# Patient Record
Sex: Female | Born: 1975 | Race: Black or African American | Hispanic: No | Smoking: Current every day smoker
Health system: Southern US, Community
[De-identification: ages and names within clinical notes are randomized; demographics above are authoritative.]

---

## 2010-07-30 ENCOUNTER — Emergency Department (INDEPENDENT_AMBULATORY_CARE_PROVIDER_SITE_OTHER): Payer: Self-pay

## 2010-07-30 ENCOUNTER — Emergency Department (HOSPITAL_BASED_OUTPATIENT_CLINIC_OR_DEPARTMENT_OTHER)
Admission: EM | Admit: 2010-07-30 | Discharge: 2010-07-30 | Disposition: A | Discharge status: Home / Self Care | Payer: Self-pay | Attending: Emergency Medicine | Admitting: Emergency Medicine

## 2010-07-30 DIAGNOSIS — J189 Pneumonia, unspecified organism: Secondary | ICD-10-CM | POA: Insufficient documentation

## 2010-07-30 DIAGNOSIS — B9689 Other specified bacterial agents as the cause of diseases classified elsewhere: Secondary | ICD-10-CM | POA: Insufficient documentation

## 2010-07-30 DIAGNOSIS — N760 Acute vaginitis: Secondary | ICD-10-CM | POA: Insufficient documentation

## 2010-07-30 DIAGNOSIS — R059 Cough, unspecified: Secondary | ICD-10-CM

## 2010-07-30 DIAGNOSIS — Z87891 Personal history of nicotine dependence: Secondary | ICD-10-CM

## 2010-07-30 DIAGNOSIS — N72 Inflammatory disease of cervix uteri: Secondary | ICD-10-CM | POA: Insufficient documentation

## 2010-07-30 DIAGNOSIS — N390 Urinary tract infection, site not specified: Secondary | ICD-10-CM | POA: Insufficient documentation

## 2010-07-30 DIAGNOSIS — R05 Cough: Secondary | ICD-10-CM

## 2010-07-30 DIAGNOSIS — A599 Trichomoniasis, unspecified: Secondary | ICD-10-CM | POA: Insufficient documentation

## 2010-07-30 DIAGNOSIS — A499 Bacterial infection, unspecified: Secondary | ICD-10-CM | POA: Insufficient documentation

## 2010-07-30 DIAGNOSIS — R109 Unspecified abdominal pain: Secondary | ICD-10-CM

## 2010-07-30 DIAGNOSIS — R509 Fever, unspecified: Secondary | ICD-10-CM

## 2010-07-30 LAB — URINALYSIS, ROUTINE W REFLEX MICROSCOPIC
Glucose, UA: NEGATIVE mg/dL
Hgb urine dipstick: NEGATIVE
Ketones, ur: 15 mg/dL — AB
Protein, ur: 30 mg/dL — AB

## 2010-07-30 LAB — WET PREP, GENITAL: Yeast Wet Prep HPF POC: NONE SEEN

## 2010-07-30 LAB — PREGNANCY, URINE: Preg Test, Ur: NEGATIVE

## 2010-07-30 LAB — URINE MICROSCOPIC-ADD ON

## 2010-12-17 ENCOUNTER — Emergency Department (INDEPENDENT_AMBULATORY_CARE_PROVIDER_SITE_OTHER): Payer: Self-pay

## 2010-12-17 ENCOUNTER — Encounter: Payer: Self-pay | Admitting: *Deleted

## 2010-12-17 ENCOUNTER — Emergency Department (HOSPITAL_BASED_OUTPATIENT_CLINIC_OR_DEPARTMENT_OTHER)
Admission: EM | Admit: 2010-12-17 | Discharge: 2010-12-17 | Disposition: A | Discharge status: Home / Self Care | Payer: Self-pay | Attending: Emergency Medicine | Admitting: Emergency Medicine

## 2010-12-17 ENCOUNTER — Other Ambulatory Visit: Payer: Self-pay

## 2010-12-17 DIAGNOSIS — R0602 Shortness of breath: Secondary | ICD-10-CM

## 2010-12-17 DIAGNOSIS — R002 Palpitations: Secondary | ICD-10-CM

## 2010-12-17 DIAGNOSIS — F17200 Nicotine dependence, unspecified, uncomplicated: Secondary | ICD-10-CM | POA: Insufficient documentation

## 2010-12-17 DIAGNOSIS — R079 Chest pain, unspecified: Secondary | ICD-10-CM

## 2010-12-17 LAB — DIFFERENTIAL
Eosinophils Absolute: 0.1 10*3/uL (ref 0.0–0.7)
Eosinophils Relative: 2 % (ref 0–5)
Lymphs Abs: 1.6 10*3/uL (ref 0.7–4.0)
Monocytes Relative: 7 % (ref 3–12)

## 2010-12-17 LAB — COMPREHENSIVE METABOLIC PANEL
Alkaline Phosphatase: 45 U/L (ref 39–117)
BUN: 9 mg/dL (ref 6–23)
CO2: 27 mEq/L (ref 19–32)
Calcium: 9.1 mg/dL (ref 8.4–10.5)
GFR calc Af Amer: >90 mL/min (ref >90)
GFR calc non Af Amer: >90 mL/min (ref >90)
Glucose, Bld: 104 mg/dL — ABNORMAL HIGH (ref 70–99)
Potassium: 3.7 mEq/L (ref 3.5–5.1)
Total Protein: 7.1 g/dL (ref 6.0–8.3)

## 2010-12-17 LAB — CBC
Hemoglobin: 11 g/dL — ABNORMAL LOW (ref 12.0–15.0)
MCH: 29.5 pg (ref 26.0–34.0)
MCV: 89.8 fL (ref 78.0–100.0)
RBC: 3.73 MIL/uL — ABNORMAL LOW (ref 3.87–5.11)

## 2010-12-17 LAB — CARDIAC PANEL(CRET KIN+CKTOT+MB+TROPI)
Relative Index: 1.2 (ref 0.0–2.5)
Total CK: 202 U/L — ABNORMAL HIGH (ref 7–177)

## 2010-12-17 MED ORDER — KETOROLAC TROMETHAMINE 30 MG/ML IJ SOLN
30.0000 mg | Freq: Once | INTRAMUSCULAR | Status: AC
  Administered 2010-12-17: 30 mg via INTRAVENOUS
  Filled 2010-12-17: qty 1

## 2010-12-17 NOTE — ED Notes (Signed)
Pt c/o chest pain described as pressure with SOB x 1 hr

## 2010-12-17 NOTE — ED Notes (Signed)
I took ecg and gave copy of old and new to Dr. Anitra Lauth, per nurse Sue Lush I had patient sit back in waiting room and notified Dr. Demetrius Charity of same.

## 2010-12-17 NOTE — ED Provider Notes (Signed)
History     CSN: [id number] Arrival date & time: 12/17/2010  3:12 PM   First MD Initiated Contact with Patient 12/17/10 1538      Chief Complaint  Patient presents with  . Chest Pain  . Palpitations    (Consider location/radiation/quality/duration/timing/severity/associated sxs/prior treatment) Patient is a 35 y.o. female presenting with chest pain and palpitations. The history is provided by the patient. No language interpreter was used.  Chest Pain The chest pain began 1 - 2 hours ago. Chest pain occurs intermittently. The chest pain is unchanged. The pain is associated with breathing and coughing. The quality of the pain is described as pleuritic. The pain radiates to the upper back. Primary symptoms include shortness of breath and palpitations. Pertinent negatives for primary symptoms include no fever, no syncope, no cough, no nausea, no vomiting and no dizziness.  The palpitations also occurred with shortness of breath. The palpitations did not occur with dizziness.  Pertinent negatives for associated symptoms include no near-syncope, no numbness and no weakness. She tried nothing for the symptoms.    Palpitations  Associated symptoms include chest pain and shortness of breath. Pertinent negatives include no fever, no numbness, no near-syncope, no syncope, no nausea, no vomiting, no dizziness, no weakness and no cough.    History reviewed. No pertinent past medical history.  Past Surgical History  Procedure Date  . Cesarean section     History reviewed. No pertinent family history.  History  Substance Use Topics  . Smoking status: Current Everyday Smoker -- 1.0 packs/day  . Smokeless tobacco: Not on file  . Alcohol Use:     OB History    Grav Para Term Preterm Abortions TAB SAB Ect Mult Living                  Review of Systems  Constitutional: Negative for fever.  Respiratory: Positive for shortness of breath. Negative for cough.   Cardiovascular: Positive  for chest pain and palpitations. Negative for syncope and near-syncope.  Gastrointestinal: Negative for nausea and vomiting.  Neurological: Negative for dizziness, weakness and numbness.  All other systems reviewed and are negative.    Allergies  Review of patient's allergies indicates no known allergies.  Home Medications   Current Outpatient Rx  Name Route Sig Dispense Refill  . ASPIRIN 325 MG PO TBEC Oral Take 650 mg by mouth once.        BP 131/85  Pulse 75  Temp(Src) 98.1 F (36.7 C) (Oral)  Resp 18  Ht 5\' 6"  (1.676 m)  Wt 133 lb (60.328 kg)  BMI 21.47 kg/m2  SpO2 100%  LMP 12/13/2010  Physical Exam  Nursing note and vitals reviewed. Constitutional: She is oriented to person, place, and time. She appears well-developed and well-nourished.  HENT:  Head: Normocephalic.  Eyes: Pupils are equal, round, and reactive to light.  Neck: Normal range of motion. Neck supple.  Cardiovascular: Normal rate and regular rhythm.   Pulmonary/Chest: Effort normal and breath sounds normal.  Abdominal: Bowel sounds are normal.  Musculoskeletal: Normal range of motion.  Neurological: She is alert and oriented to person, place, and time.  Skin: Skin is warm and dry.    ED Course  Procedures (including critical care time)  Labs Reviewed  CBC - Abnormal; Notable for the following:    RBC 3.73 (*)    Hemoglobin 11.0 (*)    HCT 33.5 (*)    All other components within normal limits  DIFFERENTIAL  COMPREHENSIVE METABOLIC  PANEL  CARDIAC PANEL(CRET KIN+CKTOT+MB+TROPI)   Dg Chest 2 View  12/17/2010  *RADIOLOGY REPORT*  Clinical Data: Shortness of breath, palpitations, chest pain, smoker  CHEST - 2 VIEW  Comparison: 07/30/2010  Findings: Normal heart size, mediastinal contours, and pulmonary vascularity. Lungs clear. Bones unremarkable. No pneumothorax.  IMPRESSION: No acute abnormalities.  Original Report Authenticated By: Lollie Marrow, M.D.    Date: 12/17/2010  Rate64  Rhythm:  normal sinus rhythm  QRS Axis: normal  Intervals: normal  ST/T Wave abnormalities: normal  Conduction Disutrbances:none  Narrative Interpretation:   Old EKG Reviewed: none available   1. Chest pain       MDM  Pt has risk factor of smoking otherwise healthy:pt is cp free at this time:unlikley cardiac:pe considered although pt is not tachycardic or hypoxic:pt is okay to follow up for palpitation with cardiology for monitoring as needed for continued symptoms       Teressa Lower, NP 12/17/10 1651  Medical screening examination/treatment/procedure(s) were performed by non-physician practitioner and as supervising physician I was immediately available for consultation/collaboration.   Gwyneth Sprout, MD 12/22/10 1718

## 2011-05-08 ENCOUNTER — Emergency Department (HOSPITAL_BASED_OUTPATIENT_CLINIC_OR_DEPARTMENT_OTHER)
Admission: EM | Admit: 2011-05-08 | Discharge: 2011-05-08 | Disposition: A | Discharge status: Home / Self Care | Payer: Self-pay | Attending: Emergency Medicine | Admitting: Emergency Medicine

## 2011-05-08 ENCOUNTER — Encounter (HOSPITAL_BASED_OUTPATIENT_CLINIC_OR_DEPARTMENT_OTHER): Payer: Self-pay | Admitting: *Deleted

## 2011-05-08 DIAGNOSIS — F17200 Nicotine dependence, unspecified, uncomplicated: Secondary | ICD-10-CM | POA: Insufficient documentation

## 2011-05-08 DIAGNOSIS — H10219 Acute toxic conjunctivitis, unspecified eye: Secondary | ICD-10-CM | POA: Insufficient documentation

## 2011-05-08 MED ORDER — FLUORESCEIN SODIUM 1 MG OP STRP
ORAL_STRIP | OPHTHALMIC | Status: AC
  Filled 2011-05-08: qty 1

## 2011-05-08 MED ORDER — FLUORESCEIN SODIUM 1 MG OP STRP: 1.0000 | ORAL_STRIP | Freq: Once | OPHTHALMIC | Status: DC

## 2011-05-08 MED ORDER — TETRACAINE HCL 0.5 % OP SOLN
OPHTHALMIC | Status: AC
  Filled 2011-05-08: qty 2

## 2011-05-08 NOTE — ED Notes (Signed)
Cant remember if she took her contact out of her left eye. Vision is blurry.

## 2011-05-08 NOTE — ED Provider Notes (Signed)
History     CSN: [id number]  Arrival date & time 05/08/11  2035   None     Chief Complaint  Patient presents with  . Eye Problem    (Consider location/radiation/quality/duration/timing/severity/associated sxs/prior treatment) Patient is a 36 y.o. female presenting with eye problem. The history is provided by the patient.  Eye Problem   She was taking out her contact lenses after she had put hand sanitizer on her hands and immediately noted some burning discomfort in her left eye. Discomfort was moderate and she rated it a 5/10 at, and is improving and is down to 2/10. She denies any visual change. She denies other injury.  No past medical history on file.  Past Surgical History  Procedure Date  . Cesarean section   . Cesarean section     No family history on file.  History  Substance Use Topics  . Smoking status: Current Everyday Smoker -- 1.0 packs/day  . Smokeless tobacco: Not on file  . Alcohol Use: Yes    OB History    Grav Para Term Preterm Abortions TAB SAB Ect Mult Living                  Review of Systems  All other systems reviewed and are negative.    Allergies  Review of patient's allergies indicates no known allergies.  Home Medications   Current Outpatient Rx  Name Route Sig Dispense Refill  . DEXTROMETHORPHAN HBR 15 MG/5ML PO SYRP Oral Take 10 mLs by mouth 4 (four) times daily as needed. Patient used this medication for cough.    . NYQUIL PO Oral Take 5 mLs by mouth daily as needed. Patient used this medication for cold.    Marland Kitchen SODIUM & POTASSIUM BICARBONATE PO TBEF Oral Take 2 tablets by mouth daily as needed. Patient used this medication for cold.      BP 126/78  Pulse 74  Temp(Src) 97.9 F (36.6 C) (Oral)  Resp 20  SpO2 100%  Physical Exam  Nursing note and vitals reviewed.  36 year old female is resting comfortably and in no acute distress. Vital signs are normal. Oxygen saturation is 100% which is normal. Head is normocephalic and  atraumatic. PERRLA, EOMI. There is slight erythema of the conjunctiva of the left eye. The cornea appears normal and anterior chamber is clear. The eye stained with fluorescein and examined with a Wood's lamp and there is no increased uptake over the cornea, although there are a few scattered areas of increased uptake over the conjunctiva. This is consistent with a chemical conjunctivitis. There is no preauricular lymph nodes palpable. Tell her Chalmers Guest is clear. Neck is nontender and supple. Lungs are clear without rales, wheezes, rhonchi. Heart has regular rate and rhythm without murmur. Abdomen is soft, flat, nontender without masses or hepatosplenomegaly. Extremities have no cyanosis or edema, full range of motion is present. Neurologic: Mental status is normal, cranial nerves are intact, there no focal motor or sensory deficits.  ED Course  Procedures (including critical care time)  Visual acuity is 20/200 in each eye without corrective lenses. There is no evidence of any corneal or actual eye injury. Since pain is improving, I do not feel she needs topical antibiotics or anything other than over-the-counter analgesics.   1. Chemical conjunctivitis       MDM  Conjunctival irritation without evidence of corneal abrasion.        Dione Booze, MD 05/08/11 [phone]

## 2011-05-08 NOTE — Discharge Instructions (Signed)
You may wear your contacts as soon as you're eye no longer feels irritated.  Chemical Conjunctivitis Chemical conjunctivitis is an irritation of the underside of the eyelid and the white part of the eye. Conjunctivitis can be caused by infection, allergy or chemical irritation. In your case it has been caused by a chemical irritation of the eye. Symptoms almost always include: tearing, light sensitivity, gritty feeling (sensation) in the eyes, swelling of your eyelids, and often severe pain. In spite of the severe pain, this irritation will run its course and will improve within 24 hours.  HOME CARE INSTRUCTIONS   To ease discomfort apply a cool, clean wash cloth to your eye for 10 to 20 minutes, 3 to 4 times per day.   Do not rub your eyes.   Gently wipe away any discharge from the eyes with moistened tissues.   Wash your hands often with soap and use paper towels to dry.   Sunglasses may be helpful if light bothers your eyes.   Do not use eye make-up.   Do not use contact lenses until the irritation is gone.   Do not operate machinery or drive if your vision is blurred.   Take medications as directed by your caregiver. Artificial tears may ease discomfort.   Avoid the chemical or surroundings which caused the problem. Always use eye protection as necessary.  SEEK MEDICAL CARE IF:   The eye is still pink (inflamed) 3 days after beginning treatment.   Pain in the eye increases.   You have discharge coming from either eye.   Your eyelids are stuck together in the morning.   You have an increased sensitivity to light.   An oral temperature above 102 F (38.9 C) develops.   You develop facial pain.   You have any problems that may be related to the medicine you are taking.  SEEK IMMEDIATE MEDICAL CARE IF:   Your vision is getting worse.   You develop severe eye pain.  MAKE SURE YOU:   Understand these instructions.   Will watch your condition.   Will get help right  away if you are not doing well or get worse.  Document Released: 11/26/2004 Document Revised: 02/05/2011 Document Reviewed: 10/05/2007 [facility] Patient Information 2012 [place], [place].

## 2014-10-05 ENCOUNTER — Encounter (HOSPITAL_BASED_OUTPATIENT_CLINIC_OR_DEPARTMENT_OTHER): Payer: Self-pay

## 2014-10-05 ENCOUNTER — Emergency Department (HOSPITAL_BASED_OUTPATIENT_CLINIC_OR_DEPARTMENT_OTHER)
Admission: EM | Admit: 2014-10-05 | Discharge: 2014-10-05 | Disposition: A | Discharge status: Home / Self Care | Payer: Medicaid Other | Attending: Emergency Medicine | Admitting: Emergency Medicine

## 2014-10-05 ENCOUNTER — Emergency Department (HOSPITAL_BASED_OUTPATIENT_CLINIC_OR_DEPARTMENT_OTHER): Payer: Medicaid Other

## 2014-10-05 DIAGNOSIS — R05 Cough: Secondary | ICD-10-CM

## 2014-10-05 DIAGNOSIS — N760 Acute vaginitis: Secondary | ICD-10-CM | POA: Insufficient documentation

## 2014-10-05 DIAGNOSIS — Z3202 Encounter for pregnancy test, result negative: Secondary | ICD-10-CM | POA: Diagnosis not present

## 2014-10-05 DIAGNOSIS — R109 Unspecified abdominal pain: Secondary | ICD-10-CM | POA: Diagnosis present

## 2014-10-05 DIAGNOSIS — R059 Cough, unspecified: Secondary | ICD-10-CM

## 2014-10-05 DIAGNOSIS — Z720 Tobacco use: Secondary | ICD-10-CM | POA: Insufficient documentation

## 2014-10-05 DIAGNOSIS — K429 Umbilical hernia without obstruction or gangrene: Secondary | ICD-10-CM | POA: Diagnosis not present

## 2014-10-05 DIAGNOSIS — J4520 Mild intermittent asthma, uncomplicated: Secondary | ICD-10-CM

## 2014-10-05 DIAGNOSIS — B9689 Other specified bacterial agents as the cause of diseases classified elsewhere: Secondary | ICD-10-CM

## 2014-10-05 LAB — URINALYSIS, ROUTINE W REFLEX MICROSCOPIC
Bilirubin Urine: NEGATIVE
Glucose, UA: NEGATIVE mg/dL
Hgb urine dipstick: NEGATIVE
Ketones, ur: NEGATIVE mg/dL
Leukocytes, UA: NEGATIVE
Nitrite: NEGATIVE
PH: 6.5 (ref 5.0–8.0)
Protein, ur: NEGATIVE mg/dL
SPECIFIC GRAVITY, URINE: 1.021 (ref 1.005–1.030)
UROBILINOGEN UA: 1 mg/dL (ref 0.0–1.0)

## 2014-10-05 LAB — WET PREP, GENITAL
Trich, Wet Prep: NONE SEEN
YEAST WET PREP: NONE SEEN

## 2014-10-05 LAB — PREGNANCY, URINE: Preg Test, Ur: NEGATIVE

## 2014-10-05 MED ORDER — ALBUTEROL SULFATE HFA 108 (90 BASE) MCG/ACT IN AERS
2.0000 | INHALATION_SPRAY | RESPIRATORY_TRACT | Status: DC | PRN
  Administered 2014-10-05: 2 via RESPIRATORY_TRACT
  Filled 2014-10-05: qty 6.7

## 2014-10-05 MED ORDER — METRONIDAZOLE 500 MG PO TABS: 500.0000 mg | ORAL_TABLET | Freq: Two times a day (BID) | ORAL | Status: DC

## 2014-10-05 NOTE — ED Notes (Signed)
Pt c/o LLQ pain, lt lower back pain and a painful knot over navel x a month; pt c/o productive cough x1wk with yellow sputum; denies vaginal discharge or urinary difficulty

## 2014-10-05 NOTE — Discharge Instructions (Signed)
Asthma Asthma is a recurring condition in which the airways tighten and narrow. Asthma can make it difficult to breathe. It can cause coughing, wheezing, and shortness of breath. Asthma episodes, also called asthma attacks, range from minor to life-threatening. Asthma cannot be cured, but medicines and lifestyle changes can help control it. CAUSES Asthma is believed to be caused by inherited (genetic) and environmental factors, but its exact cause is unknown. Asthma may be triggered by allergens, lung infections, or irritants in the air. Asthma triggers are different for each person. Common triggers include:   Animal dander.  Dust mites.  Cockroaches.  Pollen from trees or grass.  Mold.  Smoke.  Air pollutants such as dust, household cleaners, hair sprays, aerosol sprays, paint fumes, strong chemicals, or strong odors.  Cold air, weather changes, and winds (which increase molds and pollens in the air).  Strong emotional expressions such as crying or laughing hard.  Stress.  Certain medicines (such as aspirin) or types of drugs (such as beta-blockers).  Sulfites in foods and drinks. Foods and drinks that may contain sulfites include dried fruit, potato chips, and sparkling grape juice.  Infections or inflammatory conditions such as the flu, a cold, or an inflammation of the nasal membranes (rhinitis).  Gastroesophageal reflux disease (GERD).  Exercise or strenuous activity. SYMPTOMS Symptoms may occur immediately after asthma is triggered or many hours later. Symptoms include:  Wheezing.  Excessive nighttime or early morning coughing.  Frequent or severe coughing with a common cold.  Chest tightness.  Shortness of breath. DIAGNOSIS  The diagnosis of asthma is made by a review of your medical history and a physical exam. Tests may also be performed. These may include:  Lung function studies. These tests show how much air you breathe in and out.  Allergy  tests.  Imaging tests such as X-rays. TREATMENT  Asthma cannot be cured, but it can usually be controlled. Treatment involves identifying and avoiding your asthma triggers. It also involves medicines. There are 2 classes of medicine used for asthma treatment:   Controller medicines. These prevent asthma symptoms from occurring. They are usually taken every day.  Reliever or rescue medicines. These quickly relieve asthma symptoms. They are used as needed and provide short-term relief. Your health care provider will help you create an asthma action plan. An asthma action plan is a written plan for managing and treating your asthma attacks. It includes a list of your asthma triggers and how they may be avoided. It also includes information on when medicines should be taken and when their dosage should be changed. An action plan may also involve the use of a device called a peak flow meter. A peak flow meter measures how well the lungs are working. It helps you monitor your condition. HOME CARE INSTRUCTIONS   Take medicines only as directed by your health care provider. Speak with your health care provider if you have questions about how or when to take the medicines.  Use a peak flow meter as directed by your health care provider. Record and keep track of readings.  Understand and use the action plan to help minimize or stop an asthma attack without needing to seek medical care.  Control your home environment in the following ways to help prevent asthma attacks:  Do not smoke. Avoid being exposed to secondhand smoke.  Change your heating and air conditioning filter regularly.  Limit your use of fireplaces and wood stoves.  Get rid of pests (such as roaches and  mice) and their droppings.  Throw away plants if you see mold on them.  Clean your floors and dust regularly. Use unscented cleaning products.  Try to have someone else vacuum for you regularly. Stay out of rooms while they are  being vacuumed and for a short while afterward. If you vacuum, use a dust mask from a hardware store, a double-layered or microfilter vacuum cleaner bag, or a vacuum cleaner with a HEPA filter.  Replace carpet with wood, tile, or vinyl flooring. Carpet can trap dander and dust.  Use allergy-proof pillows, mattress covers, and box spring covers.  Wash bed sheets and blankets every week in hot water and dry them in a dryer.  Use blankets that are made of polyester or cotton.  Clean bathrooms and kitchens with bleach. If possible, have someone repaint the walls in these rooms with mold-resistant paint. Keep out of the rooms that are being cleaned and painted.  Wash hands frequently. SEEK MEDICAL CARE IF:   You have wheezing, shortness of breath, or a cough even if taking medicine to prevent attacks.  The colored mucus you cough up (sputum) is thicker than usual.  Your sputum changes from clear or white to yellow, green, gray, or bloody.  You have any problems that may be related to the medicines you are taking (such as a rash, itching, swelling, or trouble breathing).  You are using a reliever medicine more than 2-3 times per week.  Your peak flow is still at 50-79% of your personal best after following your action plan for 1 hour.  You have a fever. SEEK IMMEDIATE MEDICAL CARE IF:   You seem to be getting worse and are unresponsive to treatment during an asthma attack.  You are short of breath even at rest.  You get short of breath when doing very little physical activity.  You have difficulty eating, drinking, or talking due to asthma symptoms.  You develop chest pain.  You develop a fast heartbeat.  You have a bluish color to your lips or fingernails.  You are light-headed, dizzy, or faint.  Your peak flow is less than 50% of your personal best. MAKE SURE YOU:   Understand these instructions.  Will watch your condition.  Will get help right away if you are not  doing well or get worse. Document Released: 02/16/2005 Document Revised: 07/03/2013 Document Reviewed: 09/15/2012 [facility] Patient Information 2015 [place], [place]. This information is not intended to replace advice given to you by your health care provider. Make sure you discuss any questions you have with your health care provider.  Bacterial Vaginosis Bacterial vaginosis is a vaginal infection that occurs when the normal balance of bacteria in the vagina is disrupted. It results from an overgrowth of certain bacteria. This is the most common vaginal infection in women of childbearing age. Treatment is important to prevent complications, especially in pregnant women, as it can cause a premature delivery. CAUSES  Bacterial vaginosis is caused by an increase in harmful bacteria that are normally present in smaller amounts in the vagina. Several different kinds of bacteria can cause bacterial vaginosis. However, the reason that the condition develops is not fully understood. RISK FACTORS Certain activities or behaviors can put you at an increased risk of developing bacterial vaginosis, including:  Having a new sex partner or multiple sex partners.  Douching.  Using an intrauterine device (IUD) for contraception. Women do not get bacterial vaginosis from toilet seats, bedding, swimming pools, or contact with objects around them.  SIGNS AND SYMPTOMS  Some women with bacterial vaginosis have no signs or symptoms. Common symptoms include:  Grey vaginal discharge.  A fishlike odor with discharge, especially after sexual intercourse.  Itching or burning of the vagina and vulva.  Burning or pain with urination. DIAGNOSIS  Your health care provider will take a medical history and examine the vagina for signs of bacterial vaginosis. A sample of vaginal fluid may be taken. Your health care provider will look at this sample under a microscope to check for bacteria and abnormal cells. A vaginal pH test  may also be done.  TREATMENT  Bacterial vaginosis may be treated with antibiotic medicines. These may be given in the form of a pill or a vaginal cream. A second round of antibiotics may be prescribed if the condition comes back after treatment.  HOME CARE INSTRUCTIONS   Only take over-the-counter or prescription medicines as directed by your health care provider.  If antibiotic medicine was prescribed, take it as directed. Make sure you finish it even if you start to feel better.  Do not have sex until treatment is completed.  Tell all sexual partners that you have a vaginal infection. They should see their health care provider and be treated if they have problems, such as a mild rash or itching.  Practice safe sex by using condoms and only having one sex partner. SEEK MEDICAL CARE IF:   Your symptoms are not improving after 3 days of treatment.  You have increased discharge or pain.  You have a fever. MAKE SURE YOU:   Understand these instructions.  Will watch your condition.  Will get help right away if you are not doing well or get worse. FOR MORE INFORMATION  Centers for Disease Control and Prevention, Division of STD Prevention: [employer]Apps[employer] American Sexual Health Association (ASHA): www.ashastd.org  Document Released: 02/16/2005 Document Revised: 12/07/2012 Document Reviewed: 09/28/2012 [facility] Patient Information 2015 [place], [place]. This information is not intended to replace advice given to you by your health care provider. Make sure you discuss any questions you have with your health care provider.  Hernia A hernia occurs when an internal organ pushes out through a weak spot in the abdominal wall. Hernias most commonly occur in the groin and around the navel. Hernias often can be pushed back into place (reduced). Most hernias tend to get worse over time. Some abdominal hernias can get stuck in the opening (irreducible or incarcerated hernia) and cannot be reduced.  An irreducible abdominal hernia which is tightly squeezed into the opening is at risk for impaired blood supply (strangulated hernia). A strangulated hernia is a medical emergency. Because of the risk for an irreducible or strangulated hernia, surgery may be recommended to repair a hernia. CAUSES   Heavy lifting.  Prolonged coughing.  Straining to have a bowel movement.  A cut (incision) made during an abdominal surgery. HOME CARE INSTRUCTIONS   Bed rest is not required. You may continue your normal activities.  Avoid lifting more than 10 pounds (4.5 kg) or straining.  Cough gently. If you are a smoker it is best to stop. Even the best hernia repair can break down with the continual strain of coughing. Even if you do not have your hernia repaired, a cough will continue to aggravate the problem.  Do not wear anything tight over your hernia. Do not try to keep it in with an outside bandage or truss. These can damage abdominal contents if they are trapped within the hernia sac.  Eat a normal diet.  Avoid constipation. Straining over long periods of time will increase hernia size and encourage breakdown of repairs. If you cannot do this with diet alone, stool softeners may be used. SEEK IMMEDIATE MEDICAL CARE IF:   You have a fever.  You develop increasing abdominal pain.  You feel nauseous or vomit.  Your hernia is stuck outside the abdomen, looks discolored, feels hard, or is tender.  You have any changes in your bowel habits or in the hernia that are unusual for you.  You have increased pain or swelling around the hernia.  You cannot push the hernia back in place by applying gentle pressure while lying down. MAKE SURE YOU:   Understand these instructions.  Will watch your condition.  Will get help right away if you are not doing well or get worse. Document Released: 02/16/2005 Document Revised: 05/11/2011 Document Reviewed: 10/06/2007 [facility] Patient Information 2015  [place], [place]. This information is not intended to replace advice given to you by your health care provider. Make sure you discuss any questions you have with your health care provider.

## 2014-10-05 NOTE — ED Provider Notes (Signed)
CSN: [id number]     Arrival date & time 10/05/14  0620 History   First MD Initiated Contact with Patient 10/05/14 [phone]     Chief Complaint  Patient presents with  . Abdominal Pain     (Consider location/radiation/quality/duration/timing/severity/associated sxs/prior Treatment) HPI Comments: Patient presents with 3 distinct complaints. First, she complains of a productive cough with wheezing. She states it's been going on for about a week. She has a remote history of asthma but hasn't used inhaler in about a year. She is a smoker. She denies any fevers. She denies any URI symptoms. She denies any chest pain. Secondly she complains of a knot to her umbilical area. She states that's been there for about a year. She states it changes in size. Thirdly she complains of about a week history of some pain to her left lower pelvic area. It radiates to her left lower back. She denies any urinary symptoms. She denies any vaginal bleeding or discharge. Her last menstrual period was July 17. She denies any change in bowel habits.  Patient is a 39 y.o. female presenting with abdominal pain.  Abdominal Pain Associated symptoms: cough   Associated symptoms: no chest pain, no chills, no diarrhea, no fatigue, no fever, no hematuria, no nausea, no shortness of breath, no vaginal bleeding, no vaginal discharge and no vomiting     History reviewed. No pertinent past medical history. Past Surgical History  Procedure Laterality Date  . Cesarean section    . Cesarean section     No family history on file. History  Substance Use Topics  . Smoking status: Current Every Day Smoker -- 1.00 packs/day  . Smokeless tobacco: Not on file  . Alcohol Use: Yes   OB History    No data available     Review of Systems  Constitutional: Negative for fever, chills, diaphoresis and fatigue.  HENT: Negative for congestion, rhinorrhea and sneezing.   Eyes: Negative.   Respiratory: Positive for cough and wheezing. Negative  for chest tightness and shortness of breath.   Cardiovascular: Negative for chest pain and leg swelling.  Gastrointestinal: Positive for abdominal pain. Negative for nausea, vomiting, diarrhea and blood in stool.  Genitourinary: Negative for frequency, hematuria, flank pain, vaginal bleeding, vaginal discharge and difficulty urinating.  Musculoskeletal: Negative for back pain and arthralgias.  Skin: Negative for rash.  Neurological: Negative for dizziness, speech difficulty, weakness, numbness and headaches.      Allergies  Review of patient's allergies indicates no known allergies.  Home Medications   Prior to Admission medications   Medication Sig Start Date End Date Taking? Authorizing Provider  dextromethorphan 15 MG/5ML syrup Take 10 mLs by mouth 4 (four) times daily as needed. Patient used this medication for cough.    Historical Provider, MD  metroNIDAZOLE (FLAGYL) 500 MG tablet Take 1 tablet (500 mg total) by mouth 2 (two) times daily. One po bid x 7 days 10/05/14   Rolan Bucco, MD  Pseudoeph-Doxylamine-DM-APAP (NYQUIL PO) Take 5 mLs by mouth daily as needed. Patient used this medication for cold.    Historical Provider, MD  sodium-potassium bicarbonate (ALKA-SELTZER GOLD) TBEF Take 2 tablets by mouth daily as needed. Patient used this medication for cold.    Historical Provider, MD   BP 143/92 mmHg  Pulse 72  Temp(Src) 98.3 F (36.8 C) (Oral)  Resp 18  Ht 5\' 6"  (1.676 m)  Wt 134 lb (60.782 kg)  BMI 21.64 kg/m2  SpO2 100%  LMP 09/16/2014 Physical Exam  Constitutional: She is oriented to person, place, and time. She appears well-developed and well-nourished.  HENT:  Head: Normocephalic and atraumatic.  Eyes: Pupils are equal, round, and reactive to light.  Neck: Normal range of motion. Neck supple.  Cardiovascular: Normal rate, regular rhythm and normal heart sounds.   Pulmonary/Chest: Effort normal and breath sounds normal. No respiratory distress. She has no wheezes.  She has no rales. She exhibits no tenderness.  Abdominal: Soft. Bowel sounds are normal. There is tenderness. There is no rebound and no guarding.  There is a small dime-sized peri-umbilical hernia. It is reducible. It's mildly tender. She has some tenderness to her left lower pelvic area. There is no peritoneal signs. There is no CVA tenderness.  Genitourinary:  Moderate thick white discharge.  No CMT, mild left adnexal tenderness  Musculoskeletal: Normal range of motion. She exhibits no edema.  Lymphadenopathy:    She has no cervical adenopathy.  Neurological: She is alert and oriented to person, place, and time.  Skin: Skin is warm and dry. No rash noted.  Psychiatric: She has a normal mood and affect.    ED Course  Procedures (including critical care time) Labs Review Labs Reviewed  WET PREP, GENITAL - Abnormal; Notable for the following:    Clue Cells Wet Prep HPF POC MANY (*)    WBC, Wet Prep HPF POC FEW (*)    All other components within normal limits  URINALYSIS, ROUTINE W REFLEX MICROSCOPIC (NOT AT [facility]) - Abnormal; Notable for the following:    APPearance CLOUDY (*)    All other components within normal limits  PREGNANCY, URINE  GC/CHLAMYDIA PROBE AMP ([place]) NOT AT [facility]    Imaging Review Dg Chest 2 View  10/05/2014   CLINICAL DATA:  39 year old female with cough congestion wheezing and head cold for 1 week. Initial encounter. Smoker.  EXAM: CHEST  2 VIEW  COMPARISON:  12/17/2010.  FINDINGS: Stable lung volumes at the upper limits of normal. Normal cardiac size and mediastinal contours. Visualized tracheal air column is within normal limits. No pneumothorax, pulmonary edema, pleural effusion or consolidation. Mildly increased interstitial markings appear stable. No acute pulmonary opacity. No osseous abnormality identified.  IMPRESSION: No acute cardiopulmonary abnormality.   Electronically Signed   By: Odessa Fleming M.D.   On: 10/05/2014 07:11     EKG Interpretation None       MDM   Final diagnoses:  Asthma, mild intermittent, uncomplicated  Umbilical hernia without obstruction and without gangrene  BV (bacterial vaginosis)    Patient has no evidence of pneumonia. Her lungs are essentially clear on my exam. She was dispensed an albuterol inhaler as she states she's been wheezing at home. She does not appear to be any distress today. Her abdomen has a small umbilical hernia that is reducible with no signs of obstruction. She was referred to [facility] surgery for definitive repair of this. Return precautions were given if she were to have any signs of incarceration. She does have evidence of bacterial vaginosis and some mild lower abdominal tenderness on exam. I will prescribe Flagyl and she was referred to women's outpatient clinic if her symptoms are not improving. Pregnancy test was negative. Her urine is negative.    Rolan Bucco, MD 10/05/14 [phone]

## 2014-10-08 LAB — GC/CHLAMYDIA PROBE AMP (~~LOC~~) NOT AT ARMC
CHLAMYDIA, DNA PROBE: NEGATIVE
Neisseria Gonorrhea: NEGATIVE

## 2016-04-19 ENCOUNTER — Emergency Department (HOSPITAL_BASED_OUTPATIENT_CLINIC_OR_DEPARTMENT_OTHER)
Admission: EM | Admit: 2016-04-19 | Discharge: 2016-04-20 | Disposition: A | Discharge status: Home / Self Care | Payer: Self-pay | Attending: Emergency Medicine | Admitting: Emergency Medicine

## 2016-04-19 ENCOUNTER — Emergency Department (HOSPITAL_BASED_OUTPATIENT_CLINIC_OR_DEPARTMENT_OTHER): Payer: Self-pay

## 2016-04-19 ENCOUNTER — Encounter (HOSPITAL_BASED_OUTPATIENT_CLINIC_OR_DEPARTMENT_OTHER): Payer: Self-pay | Admitting: Emergency Medicine

## 2016-04-19 DIAGNOSIS — F17200 Nicotine dependence, unspecified, uncomplicated: Secondary | ICD-10-CM | POA: Insufficient documentation

## 2016-04-19 DIAGNOSIS — Z79899 Other long term (current) drug therapy: Secondary | ICD-10-CM | POA: Insufficient documentation

## 2016-04-19 DIAGNOSIS — J069 Acute upper respiratory infection, unspecified: Secondary | ICD-10-CM | POA: Insufficient documentation

## 2016-04-19 LAB — CBC WITH DIFFERENTIAL/PLATELET
Basophils Absolute: 0 10*3/uL (ref 0.0–0.1)
Basophils Relative: 1 %
EOS PCT: 1 %
Eosinophils Absolute: 0.1 10*3/uL (ref 0.0–0.7)
HEMATOCRIT: 35 % — AB (ref 36.0–46.0)
Hemoglobin: 11.4 g/dL — ABNORMAL LOW (ref 12.0–15.0)
LYMPHS ABS: 2.4 10*3/uL (ref 0.7–4.0)
LYMPHS PCT: 33 %
MCH: 29.5 pg (ref 26.0–34.0)
MCHC: 32.6 g/dL (ref 30.0–36.0)
MCV: 90.7 fL (ref 78.0–100.0)
MONO ABS: 0.6 10*3/uL (ref 0.1–1.0)
MONOS PCT: 8 %
NEUTROS ABS: 4.3 10*3/uL (ref 1.7–7.7)
Neutrophils Relative %: 57 %
PLATELETS: 323 10*3/uL (ref 150–400)
RBC: 3.86 MIL/uL — ABNORMAL LOW (ref 3.87–5.11)
RDW: 12.8 % (ref 11.5–15.5)
WBC: 7.4 10*3/uL (ref 4.0–10.5)

## 2016-04-19 LAB — PREGNANCY, URINE: Preg Test, Ur: NEGATIVE

## 2016-04-19 LAB — D-DIMER, QUANTITATIVE: D-Dimer, Quant: <0.27 ug/mL-FEU (ref 0.00–0.50)

## 2016-04-19 LAB — URINALYSIS, ROUTINE W REFLEX MICROSCOPIC
Bilirubin Urine: NEGATIVE
GLUCOSE, UA: NEGATIVE mg/dL
HGB URINE DIPSTICK: NEGATIVE
Ketones, ur: NEGATIVE mg/dL
Leukocytes, UA: NEGATIVE
Nitrite: NEGATIVE
Protein, ur: NEGATIVE mg/dL
SPECIFIC GRAVITY, URINE: 1.026 (ref 1.005–1.030)
pH: 6.5 (ref 5.0–8.0)

## 2016-04-19 LAB — BASIC METABOLIC PANEL
ANION GAP: 7 (ref 5–15)
BUN: 14 mg/dL (ref 6–20)
CALCIUM: 9.3 mg/dL (ref 8.9–10.3)
CO2: 24 mmol/L (ref 22–32)
CREATININE: 0.96 mg/dL (ref 0.44–1.00)
Chloride: 107 mmol/L (ref 101–111)
GFR calc Af Amer: >60 mL/min (ref >60)
Glucose, Bld: 93 mg/dL (ref 65–99)
Potassium: 4.2 mmol/L (ref 3.5–5.1)
Sodium: 138 mmol/L (ref 135–145)

## 2016-04-19 LAB — RAPID STREP SCREEN (MED CTR MEBANE ONLY): Streptococcus, Group A Screen (Direct): NEGATIVE

## 2016-04-19 NOTE — ED Triage Notes (Addendum)
Patient states that she is having trouble with mucous feeling caught in her throat and SOB  - reports that she feels like her pulse is irregular and coughing. Patient also reports that she is having right sided flank pain. Patient denies any chest pain

## 2016-04-19 NOTE — ED Provider Notes (Signed)
[facility] Provider Note   CSN: [id number] Arrival date & time: 04/19/16  2202   By signing my name below, I, Soijett Blue, attest that this documentation has been prepared under the direction and in the presence of Glynn Octave, MD. Electronically Signed: Soijett Blue, ED Scribe. 04/19/16. 11:12 PM.  History   Chief Complaint Chief Complaint  Patient presents with  . Shortness of Breath    HPI Mandy Barber is a 41 y.o. female who presents to the Emergency Department complaining of SOB onset yesterday. Pt reports associated non-productive cough, trouble swallowing due to mucus in throat, right flank pain. Pt has tried ASA with no relief of her symptoms. Pt has sick contacts of family members who have common colds. She denies sore throat, CP, dysuria, hematuria, vomiting, diarrhea, vaginal bleeding, vaginal discharge, fever, appetite change, and any other symptoms. Denies hx of asthma, but notes that she smokes cigarettes. Denies hx of cardiac or lung issues. Denies hx of kidney stones. Denies taking birth control. Patient's last menstrual period was 04/03/2016. Pt notes that she does have a PCP.   The history is provided by the patient. No language interpreter was used.    History reviewed. No pertinent past medical history.  There are no active problems to display for this patient.   Past Surgical History:  Procedure Laterality Date  . CESAREAN SECTION    . CESAREAN SECTION      OB History    No data available       Home Medications    Prior to Admission medications   Medication Sig Start Date End Date Taking? Authorizing Provider  dextromethorphan 15 MG/5ML syrup Take 10 mLs by mouth 4 (four) times daily as needed. Patient used this medication for cough.    Historical Provider, MD  metroNIDAZOLE (FLAGYL) 500 MG tablet Take 1 tablet (500 mg total) by mouth 2 (two) times daily. One po bid x 7 days 10/05/14   Rolan Bucco, MD  Pseudoeph-Doxylamine-DM-APAP  (NYQUIL PO) Take 5 mLs by mouth daily as needed. Patient used this medication for cold.    Historical Provider, MD  sodium-potassium bicarbonate (ALKA-SELTZER GOLD) TBEF Take 2 tablets by mouth daily as needed. Patient used this medication for cold.    Historical Provider, MD    Family History History reviewed. No pertinent family history.  Social History Social History  Substance Use Topics  . Smoking status: Current Every Day Smoker    Packs/day: 1.00  . Smokeless tobacco: Never Used  . Alcohol use Yes     Allergies   Patient has no known allergies.   Review of Systems Review of Systems A complete 10 system review of systems was obtained and all systems are negative except as noted in the HPI and PMH.   Physical Exam Updated Vital Signs BP (!) 144/105 (BP Location: Right Arm)   Pulse 77   Temp 98.1 F (36.7 C) (Oral)   Resp 21   Ht 5\' 6"  (1.676 m)   Wt 145 lb (65.8 kg)   LMP 04/03/2016   SpO2 100%   BMI 23.40 kg/m   Physical Exam  Constitutional: She is oriented to person, place, and time. She appears well-developed and well-nourished. No distress.  HENT:  Head: Normocephalic and atraumatic.  Mouth/Throat: Uvula is midline, oropharynx is clear and moist and mucous membranes are normal. No oropharyngeal exudate.  No asymmetry  Eyes: Conjunctivae and EOM are normal. Pupils are equal, round, and reactive to light.  Neck: Normal  range of motion. Neck supple.  No meningismus.  Cardiovascular: Normal rate, regular rhythm, normal heart sounds and intact distal pulses.  Exam reveals no gallop and no friction rub.   No murmur heard. Pulmonary/Chest: Effort normal and breath sounds normal. No respiratory distress. She has no wheezes. She has no rales.  Abdominal: Soft. There is no tenderness. There is CVA tenderness (right). There is no rebound and no guarding.  No RLQ tenderness.  Musculoskeletal: Normal range of motion. She exhibits no edema or tenderness.    Neurological: She is alert and oriented to person, place, and time. No cranial nerve deficit. She exhibits normal muscle tone. Coordination normal.   5/5 strength throughout. CN 2-12 intact. Equal grip strength.   Skin: Skin is warm.  Psychiatric: She has a normal mood and affect. Her behavior is normal.  Nursing note and vitals reviewed.    ED Treatments / Results  DIAGNOSTIC STUDIES: Oxygen Saturation is 100% on RA, nl by my interpretation.    COORDINATION OF CARE: 11:12 PM Discussed treatment plan with pt at bedside which includes labs, UA, EKG, CXR, rapid strep screen, and pt agreed to plan.   Labs (all labs ordered are listed, but only abnormal results are displayed) Labs Reviewed  URINALYSIS, ROUTINE W REFLEX MICROSCOPIC - Abnormal; Notable for the following:       Result Value   APPearance CLOUDY (*)    All other components within normal limits  CBC WITH DIFFERENTIAL/PLATELET - Abnormal; Notable for the following:    RBC 3.86 (*)    Hemoglobin 11.4 (*)    HCT 35.0 (*)    All other components within normal limits  RAPID STREP SCREEN (NOT AT [facility])  CULTURE, GROUP A STREP [facility])  PREGNANCY, URINE  BASIC METABOLIC PANEL  D-DIMER, QUANTITATIVE (NOT AT [facility])    EKG  EKG Interpretation  Date/Time:  Sunday April 19 2016 22:10:31 EST Ventricular Rate:  82 PR Interval:  126 QRS Duration: 66 QT Interval:  334 QTC Calculation: 390 R Axis:   76 Text Interpretation:  Normal sinus rhythm Septal infarct , age undetermined Abnormal ECG No significant change was found Confirmed by Manus Gunning  MD, Ilaisaane Marts [phone]) on 04/19/2016 11:01:02 PM       Radiology Dg Chest 2 View  Result Date: 04/19/2016 CLINICAL DATA:  Coughing congestion EXAM: CHEST  2 VIEW COMPARISON:  10/05/2014 FINDINGS: The lungs are clear wiithout focal pneumonia, edema, pneumothorax or pleural effusion. Nodular density/densities projecting over the lungs are compatible with pads for telemetry leads. The  cardiopericardial silhouette is within normal limits for size. The visualized bony structures of the thorax are intact. IMPRESSION: No active cardiopulmonary disease. Electronically Signed   By: Kennith Center M.D.   On: 04/19/2016 22:30    Procedures Procedures (including critical care time)  Medications Ordered in ED Medications - No data to display   Initial Impression / Assessment and Plan / ED Course  I have reviewed the triage vital signs and the nursing notes.  Pertinent labs & imaging results that were available during my care of the patient were reviewed by me and considered in my medical decision making (see chart for details).     Patient with one day history of shortness of breath, cough, mucus in throat. Also with right-sided flank pain. No dysuria hematuria.  Lungs are clear. Chest x-rays negative. Does have right CVA tenderness but negative UA. No hematuria. D-dimer negative.  Flank pain resolved on recheck. No RLQ pain. Doubt appendicitis, UTI,  kidney stone. Ambulatory without desaturation. Supportive care for likely viral respiratory illness. Return precautions discussed. Final Clinical Impressions(s) / ED Diagnoses   Final diagnoses:  Viral upper respiratory tract infection    New Prescriptions New Prescriptions   No medications on file   I personally performed the services described in this documentation, which was scribed in my presence. The recorded information has been reviewed and is accurate.    Glynn OctaveStephen Catilyn Boggus, MD 04/20/16 [phone]

## 2016-04-19 NOTE — ED Notes (Signed)
States has had SOB, pain to right side that goes to her back and "mucus "to her throat, since yesterday

## 2016-04-20 NOTE — Discharge Instructions (Signed)
Keep yourself hydrated. Use tylenol or motrin as needed for aches and fever. Followup with your doctor. Return to the ED if you develop new or worsening symptoms. °

## 2016-04-20 NOTE — Progress Notes (Signed)
Ambulated pt with sats remaining between 94-99%. No obvious signs of SOB. Pt states she felt the same afterwards.

## 2016-04-22 LAB — CULTURE, GROUP A STREP (THRC)

## 2016-10-02 ENCOUNTER — Encounter (HOSPITAL_BASED_OUTPATIENT_CLINIC_OR_DEPARTMENT_OTHER): Payer: Self-pay

## 2016-10-02 ENCOUNTER — Emergency Department (HOSPITAL_BASED_OUTPATIENT_CLINIC_OR_DEPARTMENT_OTHER)
Admission: EM | Admit: 2016-10-02 | Discharge: 2016-10-02 | Disposition: A | Discharge status: Home / Self Care | Payer: Medicaid Other | Attending: Emergency Medicine | Admitting: Emergency Medicine

## 2016-10-02 DIAGNOSIS — K0889 Other specified disorders of teeth and supporting structures: Secondary | ICD-10-CM | POA: Insufficient documentation

## 2016-10-02 MED ORDER — NAPROXEN 500 MG PO TABS: 500.0000 mg | ORAL_TABLET | Freq: Two times a day (BID) | ORAL | 0 refills | Status: DC

## 2016-10-02 MED ORDER — PENICILLIN V POTASSIUM 500 MG PO TABS: 500.0000 mg | ORAL_TABLET | Freq: Four times a day (QID) | ORAL | 0 refills | Status: AC

## 2016-10-02 MED FILL — PENICILLIN VK 500 MG TABLET: 500 | 7 days supply | Qty: 28 | Fill #0

## 2016-10-02 MED FILL — NAPROXEN 500 MG TABLET: 500 | 15 days supply | Qty: 30 | Fill #0

## 2016-10-02 NOTE — Discharge Instructions (Signed)
Please read attached information regarding her condition. Take penicillin 4 times daily for 1 week. Take naproxen as needed for pain. Follow-up with dentist listed below for further evaluation. Return to ED for worsening pain, trouble breathing, trouble swallowing, neck pain, fevers.

## 2016-10-02 NOTE — ED Triage Notes (Signed)
C/o issues with left upper tooth but she broke tooth eating pork skins today-NAD-steady gait

## 2016-10-02 NOTE — ED Provider Notes (Signed)
[facility]   CSN: [id number] Arrival date & time: 10/02/16  1522     History   Chief Complaint Chief Complaint  Patient presents with  . Dental Pain    HPI Mandy Barber is a 41 y.o. female.  HPI Patient presents to ED for evaluation of left upper tooth pain that began today. She states that the tooth was chipped in the past but while she was eating a crunchy pork rind the tooth broke off. She denies any previous history of similar symptoms. She does not have a dentist. She has not tried any medications for pain. She denies any trouble breathing, trouble swallowing, neck pain, drooling, trouble opening mouth or other symptoms at this time.  History reviewed. No pertinent past medical history.  There are no active problems to display for this patient.   Past Surgical History:  Procedure Laterality Date  . CESAREAN SECTION    . CESAREAN SECTION      OB History    No data available       Home Medications    Prior to Admission medications   Medication Sig Start Date End Date Taking? Authorizing Provider  naproxen (NAPROSYN) 500 MG tablet Take 1 tablet (500 mg total) by mouth 2 (two) times daily. 10/02/16   Jasean Ambrosia, PA-C  penicillin v potassium (VEETID) 500 MG tablet Take 1 tablet (500 mg total) by mouth 4 (four) times daily. 10/02/16 10/09/16  Dietrich PatesKhatri, Ahnesti Townsend, PA-C    Family History No family history on file.  Social History Social History  Substance Use Topics  . Smoking status: Current Every Day Smoker    Packs/day: 1.00  . Smokeless tobacco: Never Used  . Alcohol use Yes     Comment: occ     Allergies   Patient has no known allergies.   Review of Systems Review of Systems  Constitutional: Negative for chills and fever.  HENT: Positive for dental problem. Negative for drooling, sore throat and trouble swallowing.   Respiratory: Negative for choking.   Gastrointestinal: Negative for nausea and vomiting.     Physical  Exam Updated Vital Signs BP (!) 134/92 (BP Location: Left Arm)   Pulse 94   Temp 98.8 F (37.1 C) (Oral)   Resp 18   Ht 5\' 6"  (1.676 m)   Wt 64.9 kg (143 lb)   LMP 09/28/2016   SpO2 100%   BMI 23.08 kg/m   Physical Exam  Constitutional: She appears well-developed and well-nourished. No distress.  HENT:  Head: Normocephalic and atraumatic.  Mouth/Throat:    Chipped tooth at the indicated area. No facial, neck or cheek swelling noted. No pooling of secretions or trismus.  Normal voice noted with no difficulty swallowing or breathing.  Eyes: Conjunctivae and EOM are normal. No scleral icterus.  Neck: Normal range of motion.  Pulmonary/Chest: Effort normal. No respiratory distress.  Neurological: She is alert.  Skin: No rash noted. She is not diaphoretic.  Psychiatric: She has a normal mood and affect.  Nursing note and vitals reviewed.    ED Treatments / Results  Labs (all labs ordered are listed, but only abnormal results are displayed) Labs Reviewed - No data to display  EKG  EKG Interpretation None       Radiology No results found.  Procedures Procedures (including critical care time)  Medications Ordered in ED Medications - No data to display   Initial Impression / Assessment and Plan / ED Course  I have reviewed the  triage vital signs and the nursing notes.  Pertinent labs & imaging results that were available during my care of the patient were reviewed by me and considered in my medical decision making (see chart for details).     Patient presents to ED for evaluation of dental pain that began today after tripping tooth while eating a crunchy food. She denies any previous history of similar symptoms. She has not seen a dentist in the past. On physical exam there is a chipped tooth in the upper gumline. No surrounding erythema or drainage noted. She is not having trouble breathing, trouble swallowing, no drooling or voice change noted. Low suspicion for  Ludwig's angina or other deep tissue infection. We will give penicillin, naproxen and referral to dentist for further evaluation. Patient appears stable for discharge at this time. Strict return precautions given.  Final Clinical Impressions(s) / ED Diagnoses   Final diagnoses:  Pain, dental    New Prescriptions New Prescriptions   NAPROXEN (NAPROSYN) 500 MG TABLET    Take 1 tablet (500 mg total) by mouth 2 (two) times daily.   PENICILLIN V POTASSIUM (VEETID) 500 MG TABLET    Take 1 tablet (500 mg total) by mouth 4 (four) times daily.     Dietrich PatesKhatri, Jaylen Claude, PA-C 10/02/16 1552    Shaune PollackIsaacs, [place], MD 10/03/16 [phone]

## 2017-04-17 ENCOUNTER — Emergency Department (HOSPITAL_BASED_OUTPATIENT_CLINIC_OR_DEPARTMENT_OTHER): Payer: Self-pay

## 2017-04-17 ENCOUNTER — Encounter (HOSPITAL_BASED_OUTPATIENT_CLINIC_OR_DEPARTMENT_OTHER): Payer: Self-pay | Admitting: Emergency Medicine

## 2017-04-17 ENCOUNTER — Other Ambulatory Visit: Payer: Self-pay

## 2017-04-17 ENCOUNTER — Emergency Department (HOSPITAL_BASED_OUTPATIENT_CLINIC_OR_DEPARTMENT_OTHER)
Admission: EM | Admit: 2017-04-17 | Discharge: 2017-04-17 | Disposition: A | Discharge status: Home / Self Care | Payer: Self-pay | Attending: Emergency Medicine | Admitting: Emergency Medicine

## 2017-04-17 DIAGNOSIS — R0789 Other chest pain: Secondary | ICD-10-CM | POA: Insufficient documentation

## 2017-04-17 DIAGNOSIS — R05 Cough: Secondary | ICD-10-CM | POA: Insufficient documentation

## 2017-04-17 DIAGNOSIS — F17210 Nicotine dependence, cigarettes, uncomplicated: Secondary | ICD-10-CM | POA: Insufficient documentation

## 2017-04-17 DIAGNOSIS — R079 Chest pain, unspecified: Secondary | ICD-10-CM | POA: Insufficient documentation

## 2017-04-17 DIAGNOSIS — Z79899 Other long term (current) drug therapy: Secondary | ICD-10-CM | POA: Insufficient documentation

## 2017-04-17 LAB — BASIC METABOLIC PANEL
Anion gap: 9 (ref 5–15)
BUN: 12 mg/dL (ref 6–20)
CALCIUM: 9.7 mg/dL (ref 8.9–10.3)
CO2: 22 mmol/L (ref 22–32)
Chloride: 105 mmol/L (ref 101–111)
Creatinine, Ser: 0.88 mg/dL (ref 0.44–1.00)
GFR calc Af Amer: >60 mL/min (ref >60)
GLUCOSE: 92 mg/dL (ref 65–99)
Potassium: 4.2 mmol/L (ref 3.5–5.1)
SODIUM: 136 mmol/L (ref 135–145)

## 2017-04-17 LAB — CBC
HCT: 41.6 % (ref 36.0–46.0)
Hemoglobin: 13.7 g/dL (ref 12.0–15.0)
MCH: 30 pg (ref 26.0–34.0)
MCHC: 32.9 g/dL (ref 30.0–36.0)
MCV: 91.2 fL (ref 78.0–100.0)
Platelets: 313 10*3/uL (ref 150–400)
RBC: 4.56 MIL/uL (ref 3.87–5.11)
RDW: 13.2 % (ref 11.5–15.5)
WBC: 5.6 10*3/uL (ref 4.0–10.5)

## 2017-04-17 LAB — TROPONIN I

## 2017-04-17 LAB — PREGNANCY, URINE: PREG TEST UR: NEGATIVE

## 2017-04-17 NOTE — Discharge Instructions (Addendum)
Ms. Mandy Barber,  Your chest pain is coming from the muscles. Your chest x-ray and marker of stress of the heart were normal. Non-steroidal anti-inflammatories like ibuprofen can help. It will likely take a couple weeks for pain to go away. Heat may feel good as well--you could try hot baths or heated creams like capsaicin.

## 2017-04-17 NOTE — ED Provider Notes (Signed)
[facility] EMERGENCY DEPARTMENT Provider Note   CSN: [id number] Arrival date & time: 04/17/17  1643     History   Chief Complaint Chief Complaint  Patient presents with  . Shortness of Breath  . Chest Pain    HPI Mandy Barber is a 42 y.o. female with history of tobacco abuse who presents for cough and shortness of breath.   HPI Patient reports symptoms started after cleaning a home with a lot of different cleaners last Saturday. She had cough starting the next day and shortness of breath but only when coughing. She is not coughing very frequently and it is not affecting her sleep. She is concerned, however, that cough continues. She also has pain of her left back and ribcage around left breast. She has had subjective fever and chills and nasal congestion. Some sore throat. Has not taken anything for pain.   History reviewed. No pertinent past medical history.  There are no active problems to display for this patient.   Past Surgical History:  Procedure Laterality Date  . CESAREAN SECTION    . CESAREAN SECTION      OB History    No data available       Home Medications    Prior to Admission medications   Medication Sig Start Date End Date Taking? Authorizing Provider  naproxen (NAPROSYN) 500 MG tablet Take 1 tablet (500 mg total) by mouth 2 (two) times daily. 10/02/16   Dietrich PatesKhatri, Hina, PA-C    Family History No family history on file.  Social History Social History   Tobacco Use  . Smoking status: Current Every Day Smoker    Packs/day: 1.00  . Smokeless tobacco: Never Used  Substance Use Topics  . Alcohol use: Yes    Comment: occ  . Drug use: No     Allergies   Patient has no known allergies.   Review of Systems Review of Systems  Constitutional: Positive for chills and fever.  HENT: Positive for congestion. Negative for sinus pain.   Eyes: Negative for pain.  Respiratory: Positive for cough and shortness of breath (only with cough).  Negative for wheezing.   Cardiovascular: Negative for palpitations.  Gastrointestinal: Negative for abdominal pain, diarrhea, nausea and vomiting.  Genitourinary: Negative for dysuria.  Musculoskeletal: Positive for back pain. Negative for neck pain.  Skin: Negative for rash.  Neurological: Negative for dizziness.     Physical Exam Updated Vital Signs BP (!) 132/100 (BP Location: Right Arm)   Pulse 70   Temp 98.5 F (36.9 C) (Oral)   Resp 16   LMP 03/29/2017   SpO2 100%   Physical Exam  Constitutional: She is oriented to person, place, and time. She appears well-developed and well-nourished. She does not appear ill. No distress.  HENT:  Head: Normocephalic and atraumatic.  Mouth/Throat: Oropharynx is clear and moist.  Nasal congestion and swelling of nasal turbinates present.  Eyes: EOM are normal. Pupils are equal, round, and reactive to light.  Neck: Normal range of motion. Neck supple.  Cardiovascular: Normal rate, regular rhythm and normal heart sounds.  No murmur heard. Pulmonary/Chest: Effort normal and breath sounds normal. No respiratory distress. She has no decreased breath sounds. She has no wheezes. She exhibits tenderness (over left breast near sternum and over posterior left rib cage).  Abdominal: Soft. Bowel sounds are normal. There is no tenderness.  Musculoskeletal:       Right lower leg: Normal. She exhibits no tenderness and no edema.  Left lower leg: Normal. She exhibits no tenderness and no edema.  Lymphadenopathy:    She has no cervical adenopathy.  Neurological: She is alert and oriented to person, place, and time.  Skin: Skin is warm and dry. No rash noted.  Psychiatric: She has a normal mood and affect.  Nursing note and vitals reviewed.    ED Treatments / Results  Labs (all labs ordered are listed, but only abnormal results are displayed) Labs Reviewed  PREGNANCY, URINE  BASIC METABOLIC PANEL  CBC  TROPONIN I    EKG  EKG  Interpretation  Date/Time:  Saturday April 17 2017 16:52:42 EST Ventricular Rate:  73 PR Interval:  128 QRS Duration: 66 QT Interval:  366 QTC Calculation: 403 R Axis:   80 Text Interpretation:  Normal sinus rhythm Septal infarct , age undetermined No significant change since last tracing Confirmed by Gwyneth Sprout ([id number]) on 04/17/2017 6:47:57 PM       Radiology Dg Chest 2 View  Result Date: 04/17/2017 CLINICAL DATA:  Left chest pain EXAM: CHEST  2 VIEW COMPARISON:  04/19/2016 FINDINGS: The heart size and mediastinal contours are within normal limits. Both lungs are clear. The visualized skeletal structures are unremarkable. IMPRESSION: No active cardiopulmonary disease. Electronically Signed   By: Judie Petit.  Shick M.D.   On: 04/17/2017 17:33    Procedures Procedures (including critical care time)  Medications Ordered in ED Medications - No data to display   Initial Impression / Assessment and Plan / ED Course  I have reviewed the triage vital signs and the nursing notes.  Pertinent labs & imaging results that were available during my care of the patient were reviewed by me and considered in my medical decision making (see chart for details).  Patient without evidence of pneumonia on CXR. Troponin < 0.03 and EKG normal.   Offered albuterol inhaler to try in case of possible bronchitis given tobacco abuse, but patient declines.   Final Clinical Impressions(s) / ED Diagnoses   Final diagnoses:  Chest wall pain   Recommended supportive care with ibuprofen and tylenol as needed for pleuritic musculoskeletal pain in setting of cough suspect 2/2 URI. EKG and troponin normal and pain reproducible, not consistent with cardiac chest pain.  ED Discharge Orders    None     Dani Gobble, MD [facility] Family Medicine, PGY-3    Casey Burkitt, MD 04/18/17 Ivor Reining    Gwyneth Sprout, MD 04/19/17 [phone]

## 2017-04-17 NOTE — ED Triage Notes (Signed)
SOB and L side chest pain x 2 days.

## 2017-07-01 ENCOUNTER — Encounter (HOSPITAL_BASED_OUTPATIENT_CLINIC_OR_DEPARTMENT_OTHER): Payer: Self-pay | Admitting: *Deleted

## 2017-07-01 ENCOUNTER — Other Ambulatory Visit: Payer: Self-pay

## 2017-07-01 ENCOUNTER — Emergency Department (HOSPITAL_BASED_OUTPATIENT_CLINIC_OR_DEPARTMENT_OTHER): Payer: Medicaid Other

## 2017-07-01 ENCOUNTER — Emergency Department (HOSPITAL_BASED_OUTPATIENT_CLINIC_OR_DEPARTMENT_OTHER)
Admission: EM | Admit: 2017-07-01 | Discharge: 2017-07-01 | Disposition: A | Discharge status: Home / Self Care | Payer: Medicaid Other | Attending: Emergency Medicine | Admitting: Emergency Medicine

## 2017-07-01 DIAGNOSIS — M898X1 Other specified disorders of bone, shoulder: Secondary | ICD-10-CM | POA: Insufficient documentation

## 2017-07-01 DIAGNOSIS — R0789 Other chest pain: Secondary | ICD-10-CM

## 2017-07-01 DIAGNOSIS — F17210 Nicotine dependence, cigarettes, uncomplicated: Secondary | ICD-10-CM | POA: Insufficient documentation

## 2017-07-01 DIAGNOSIS — Z79899 Other long term (current) drug therapy: Secondary | ICD-10-CM | POA: Insufficient documentation

## 2017-07-01 LAB — BASIC METABOLIC PANEL
ANION GAP: 8 (ref 5–15)
BUN: 11 mg/dL (ref 6–20)
CALCIUM: 9.5 mg/dL (ref 8.9–10.3)
CHLORIDE: 106 mmol/L (ref 101–111)
CO2: 23 mmol/L (ref 22–32)
Creatinine, Ser: 0.94 mg/dL (ref 0.44–1.00)
GFR calc non Af Amer: >60 mL/min (ref >60)
GLUCOSE: 93 mg/dL (ref 65–99)
POTASSIUM: 4.1 mmol/L (ref 3.5–5.1)
Sodium: 137 mmol/L (ref 135–145)

## 2017-07-01 LAB — HCG, QUANTITATIVE, PREGNANCY: hCG, Beta Chain, Quant, S: <1 m[IU]/mL (ref <5)

## 2017-07-01 LAB — CBC WITH DIFFERENTIAL/PLATELET
BASOS PCT: 1 %
Basophils Absolute: 0 10*3/uL (ref 0.0–0.1)
Eosinophils Absolute: 0 10*3/uL (ref 0.0–0.7)
Eosinophils Relative: 1 %
HCT: 38.2 % (ref 36.0–46.0)
HEMOGLOBIN: 12.9 g/dL (ref 12.0–15.0)
LYMPHS ABS: 2.2 10*3/uL (ref 0.7–4.0)
LYMPHS PCT: 34 %
MCH: 29.9 pg (ref 26.0–34.0)
MCHC: 33.8 g/dL (ref 30.0–36.0)
MCV: 88.4 fL (ref 78.0–100.0)
MONO ABS: 0.4 10*3/uL (ref 0.1–1.0)
Monocytes Relative: 6 %
NEUTROS ABS: 3.7 10*3/uL (ref 1.7–7.7)
NEUTROS PCT: 58 %
Platelets: 289 10*3/uL (ref 150–400)
RBC: 4.32 MIL/uL (ref 3.87–5.11)
RDW: 12.8 % (ref 11.5–15.5)
WBC: 6.3 10*3/uL (ref 4.0–10.5)

## 2017-07-01 LAB — PREGNANCY, URINE: PREG TEST UR: NEGATIVE

## 2017-07-01 LAB — TROPONIN I: Troponin I: <0.03 ng/mL (ref <0.03)

## 2017-07-01 MED ORDER — CYCLOBENZAPRINE HCL 5 MG PO TABS
5.0000 mg | ORAL_TABLET | Freq: Once | ORAL | Status: AC
  Administered 2017-07-01: 5 mg via ORAL
  Filled 2017-07-01: qty 1

## 2017-07-01 MED ORDER — KETOROLAC TROMETHAMINE 30 MG/ML IJ SOLN
30.0000 mg | Freq: Once | INTRAMUSCULAR | Status: AC
  Administered 2017-07-01: 30 mg via INTRAVENOUS
  Filled 2017-07-01: qty 1

## 2017-07-01 MED ORDER — MELOXICAM 15 MG PO TABS: 15.0000 mg | ORAL_TABLET | Freq: Every day | ORAL | 0 refills | Status: DC

## 2017-07-01 MED ORDER — METHOCARBAMOL 500 MG PO TABS: 500.0000 mg | ORAL_TABLET | Freq: Every evening | ORAL | 0 refills | Status: DC | PRN

## 2017-07-01 NOTE — Discharge Instructions (Signed)
Read instructions below for reasons to return to the Emergency Department. It is recommended that your follow up with your Primary Care Doctor in regards to today's visit. If you do not have a doctor, use the resource guide listed below to help you find one.  Tests performed today include: An EKG of your heart A chest x-ray Cardiac enzymes - a blood test for heart muscle damage Blood counts and electrolytes Vital signs. See below for your results today.   Chest Pain (Nonspecific)  HOME CARE INSTRUCTIONS  For the next few days, avoid physical activities that bring on chest pain. Continue physical activities as directed.  Do not smoke cigarettes or drink alcohol until your symptoms are gone. If you do smoke, it is time to quit. You may receive instructions and counseling on how to stop smoking. Only take over-the-counter or prescription medicine for pain, discomfort, or fever as directed by your caregiver.  Follow your caregiver's suggestions for further testing if your chest pain does not go away.  Keep any follow-up appointments you made. If you do not go to an appointment, you could develop lasting (chronic) problems with pain. If there is any problem keeping an appointment, you must call to reschedule.  SEEK MEDICAL CARE IF:  You think you are having problems from the medicine you are taking. Read your medicine instructions carefully.  Your chest pain does not go away, even after treatment.  You develop a rash with blisters on your chest.  SEEK IMMEDIATE MEDICAL CARE IF:  You have increased chest pain or pain that spreads to your arm, neck, jaw, back, or belly (abdomen).  You develop shortness of breath, an increasing cough, or you are coughing up blood.  You have severe back or abdominal pain, feel sick to your stomach (nauseous) or throw up (vomit).  You develop severe weakness, fainting, or chills.  You have an oral temperature above 102 F (38.9 C), not controlled by medicine.  THIS  IS AN EMERGENCY. Do not wait to see if the pain will go away. Get medical help at once. Call your local emergency services (911 in [place] Do not drive yourself to the hospital. Additional Information:  Your vital signs today were: BP (!) 160/98 (BP Location: Left Arm)    Pulse 62    Temp 98.4 F (36.9 C) (Oral)    Resp 18    Ht  (1.676 m)    Wt 60.3 kg (133 lb)    LMP 06/14/2017    SpO2 98%    BMI 21.47 kg/m  If your blood pressure (BP) was elevated above 135/85 this visit, please have this repeated by your doctor within one month. ---------------

## 2017-07-01 NOTE — ED Triage Notes (Signed)
Left scapula pain with radiation into her left anterior chest x 3 days. States the pain seems to be gradually getting better. She has been taking OTC medication. Pain increases with movement.

## 2017-07-01 NOTE — ED Provider Notes (Signed)
[facility] EMERGENCY DEPARTMENT Provider Note   CSN: [id number] Arrival date & time: 07/01/17  1641     History   Chief Complaint Chief Complaint  Patient presents with  . Back Pain    HPI Mandy Barber is a 42 y.o. female no significant past medical history presents emergency department today for back pain.  Patient states that she does a lot of heavy lifting at work.  She noticed that she started having pain at the base of her left scapula that radiated into her left anterior chest approximately 3 days ago.  She notes that this pain is constant it is worse with movement of her arm as well as picking up things as work.  She denies any associated nausea, emesis, diaphoresis or shortness of breath.  The patient is not exertional or positional in nature.  She has been taking over-the-counter medications for her symptoms with mild relief.  She denies any history of hypertension, hyperlipidemia, diabetes, prior MI, prior CVA/TIA or history of PAD.  No family history of early cardiac disease.  She is a daily smoker.  No prior heart catheterization, echocardiogram or stress test.  She denies any risk factors for PE including exogenous estrogen use, recent surgery or travel, trauma, immobilization, previous blood clot, cough, hemoptysis, cancer, lower extremity pain or swelling.  No preceding URI symptoms.  HPI  History reviewed. No pertinent past medical history.  There are no active problems to display for this patient.   Past Surgical History:  Procedure Laterality Date  . CESAREAN SECTION    . CESAREAN SECTION       OB History   None      Home Medications    Prior to Admission medications   Medication Sig Start Date End Date Taking? Authorizing Provider  naproxen (NAPROSYN) 500 MG tablet Take 1 tablet (500 mg total) by mouth 2 (two) times daily. 10/02/16   Dietrich Pates, PA-C    Family History No family history on file.  Social History Social History   Tobacco  Use  . Smoking status: Current Every Day Smoker    Packs/day: 1.00  . Smokeless tobacco: Never Used  Substance Use Topics  . Alcohol use: Yes    Comment: occ  . Drug use: No     Allergies   Patient has no known allergies.   Review of Systems Review of Systems  All other systems reviewed and are negative.    Physical Exam Updated Vital Signs BP (!) 160/98 (BP Location: Left Arm)   Pulse 62   Temp 98.4 F (36.9 C) (Oral)   Resp 18   Ht  (1.676 m)   Wt 60.3 kg (133 lb)   LMP 06/14/2017   SpO2 98%   BMI 21.47 kg/m   Physical Exam  Constitutional: She appears well-developed and well-nourished.  HENT:  Head: Normocephalic and atraumatic.  Right Ear: External ear normal.  Left Ear: External ear normal.  Nose: Nose normal.  Mouth/Throat: Uvula is midline, oropharynx is clear and moist and mucous membranes are normal. No tonsillar exudate.  Eyes: Pupils are equal, round, and reactive to light. Right eye exhibits no discharge. Left eye exhibits no discharge. No scleral icterus.  Neck: Trachea normal. Neck supple. No JVD present. No spinous process tenderness present. Carotid bruit is not present. No neck rigidity. Normal range of motion present.  Cardiovascular: Normal rate, regular rhythm and intact distal pulses.  No murmur heard. Pulses:      Radial pulses  are 2+ on the right side, and 2+ on the left side.       Dorsalis pedis pulses are 2+ on the right side, and 2+ on the left side.       Posterior tibial pulses are 2+ on the right side, and 2+ on the left side.  No lower extremity swelling or edema. Calves symmetric in size bilaterally.  Pulmonary/Chest: Effort normal and breath sounds normal. She exhibits no tenderness.  Abdominal: Soft. Bowel sounds are normal. There is no tenderness. There is no rebound and no guarding.  Musculoskeletal: She exhibits no edema.       Right shoulder: Normal. She exhibits normal range of motion and no tenderness.        Back:  Compartments are soft of the upper extremities.  She is neurovascularly intact in all 4 extremities.  Lymphadenopathy:    She has no cervical adenopathy.  Neurological: She is alert.  Speech clear. Follows commands. No facial droop. PERRLA. EOM grossly intact. CN III-XII grossly intact. Grossly moves all extremities 4 without ataxia. Able and appropriate strength for age to upper and lower extremities bilaterally.   Skin: Skin is warm, dry and intact. Capillary refill takes less than 2 seconds. No rash noted. She is not diaphoretic.  No vesicular-like rash on the patient's back or chest wall.  No shoulder or erythema or joint swelling.  Psychiatric: She has a normal mood and affect.  Nursing note and vitals reviewed.    ED Treatments / Results  Labs (all labs ordered are listed, but only abnormal results are displayed) Labs Reviewed  BASIC METABOLIC PANEL  CBC WITH DIFFERENTIAL/PLATELET  TROPONIN I  PREGNANCY, URINE  HCG, QUANTITATIVE, PREGNANCY    EKG EKG Interpretation  Date/Time:  Thursday Jul 01 2017 16:52:08 EDT Ventricular Rate:  80 PR Interval:  132 QRS Duration: 66 QT Interval:  354 QTC Calculation: 408 R Axis:   69 Text Interpretation:  Normal sinus rhythm Nonspecific T wave abnormality Abnormal ECG When compared to prior, no significant changes seen.  No STEMI Confirmed by Theda Belfast ([id number]) on 07/01/2017 5:59:18 PM   Radiology Dg Chest 2 View  Result Date: 07/01/2017 CLINICAL DATA:  Left posterior chest pain EXAM: CHEST - 2 VIEW COMPARISON:  04/17/2017 FINDINGS: The lungs are clear without focal pneumonia, edema, pneumothorax or pleural effusion. The cardiopericardial silhouette is within normal limits for size. The visualized bony structures of the thorax are intact. Nodular density/densities projecting over the lungs are compatible with pads for telemetry leads. IMPRESSION: No active cardiopulmonary disease. Electronically Signed   By: Kennith Center M.D.    On: 07/01/2017 18:34    Procedures Procedures (including critical care time)  Medications Ordered in ED Medications  ketorolac (TORADOL) 30 MG/ML injection 30 mg (has no administration in time range)  cyclobenzaprine (FLEXERIL) tablet 5 mg (has no administration in time range)     Initial Impression / Assessment and Plan / ED Course  I have reviewed the triage vital signs and the nursing notes.  Pertinent labs & imaging results that were available during my care of the patient were reviewed by me and considered in my medical decision making (see chart for details).     42 y.o. female that presents with right scapular pain that radiates to her right chest and is worse with movement of the arm. Patient is to be discharged with recommendation to follow up with PCP in regards to today's hospital visit. Will tx as msk pain. Chest pain  is not likely of cardiac or pulmonary etiology due to presentation, perc negative, stable vital signs, no tracheal deviation, no JVD or new murmur, RRR, breath sounds equal bilaterally, EKG without acute abnormalities, negative troponin, and negative CXR. HEART score is low risk. Patient has been advised to return to the ED if chest pain becomes exertional, associated with diaphoresis or nausea, radiates to left jaw/arm, worsens or becomes concerning in any way. Patient appears reliable for follow up and is agreeable to discharge. I advised the patient to follow-up with their primary care provider this week. I advised the patient to return to the emergency department with new or worsening symptoms or new concerns. The patient verbalized understanding and agreement with plan. Patient stable for discharge.   Final Clinical Impressions(s) / ED Diagnoses   Final diagnoses:  Pain in scapula  Atypical chest pain    ED Discharge Orders        Ordered    meloxicam (MOBIC) 15 MG tablet  Daily     07/01/17 1950    methocarbamol (ROBAXIN) 500 MG tablet  At bedtime  PRN     07/01/17 1950       Princella Pellegrini 07/01/17 1952    Tegeler, Canary Brim, MD 07/02/17 [phone]

## 2017-09-24 ENCOUNTER — Other Ambulatory Visit: Payer: Self-pay

## 2017-09-24 ENCOUNTER — Encounter (HOSPITAL_BASED_OUTPATIENT_CLINIC_OR_DEPARTMENT_OTHER): Payer: Self-pay | Admitting: Emergency Medicine

## 2017-09-24 ENCOUNTER — Emergency Department (HOSPITAL_BASED_OUTPATIENT_CLINIC_OR_DEPARTMENT_OTHER)
Admission: EM | Admit: 2017-09-24 | Discharge: 2017-09-24 | Disposition: A | Discharge status: Home / Self Care | Payer: Medicaid Other | Attending: Emergency Medicine | Admitting: Emergency Medicine

## 2017-09-24 DIAGNOSIS — Z5321 Procedure and treatment not carried out due to patient leaving prior to being seen by health care provider: Secondary | ICD-10-CM | POA: Insufficient documentation

## 2017-09-24 DIAGNOSIS — H5712 Ocular pain, left eye: Secondary | ICD-10-CM | POA: Insufficient documentation

## 2017-09-24 NOTE — ED Triage Notes (Signed)
L eye pain and drainage since yesterday.

## 2017-09-27 NOTE — ED Notes (Signed)
Follow up call made  Mandy Barber not in  09/27/17  1301  s [name] rn

## 2018-08-05 IMAGING — CR DG CHEST 2V
2 series · 2 of 2 positions shown · non-contrast
Comparison: 04/19/2016

CLINICAL DATA: Left chest pain

EXAM:
CHEST  2 VIEW

[w chest pa]
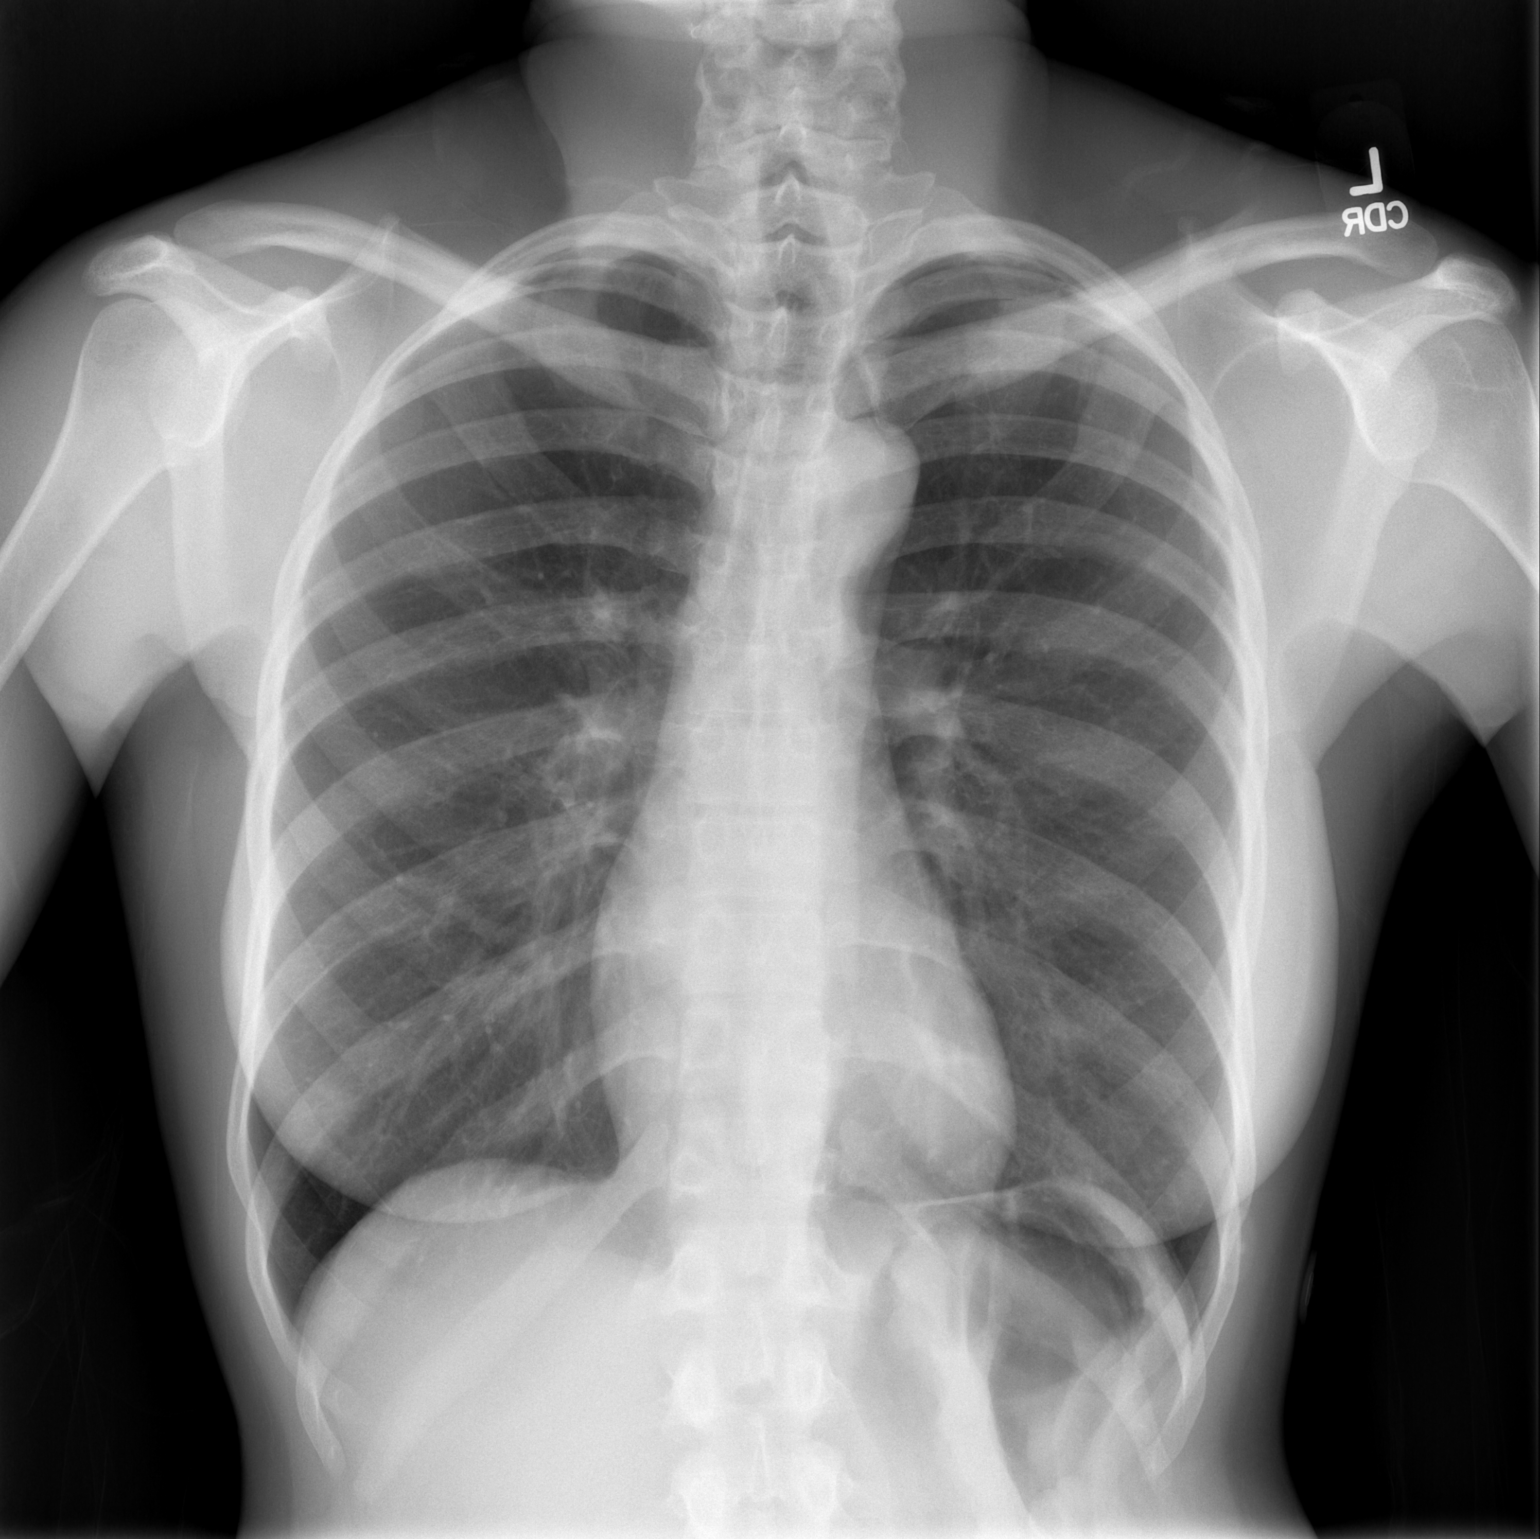

[w chest lat]
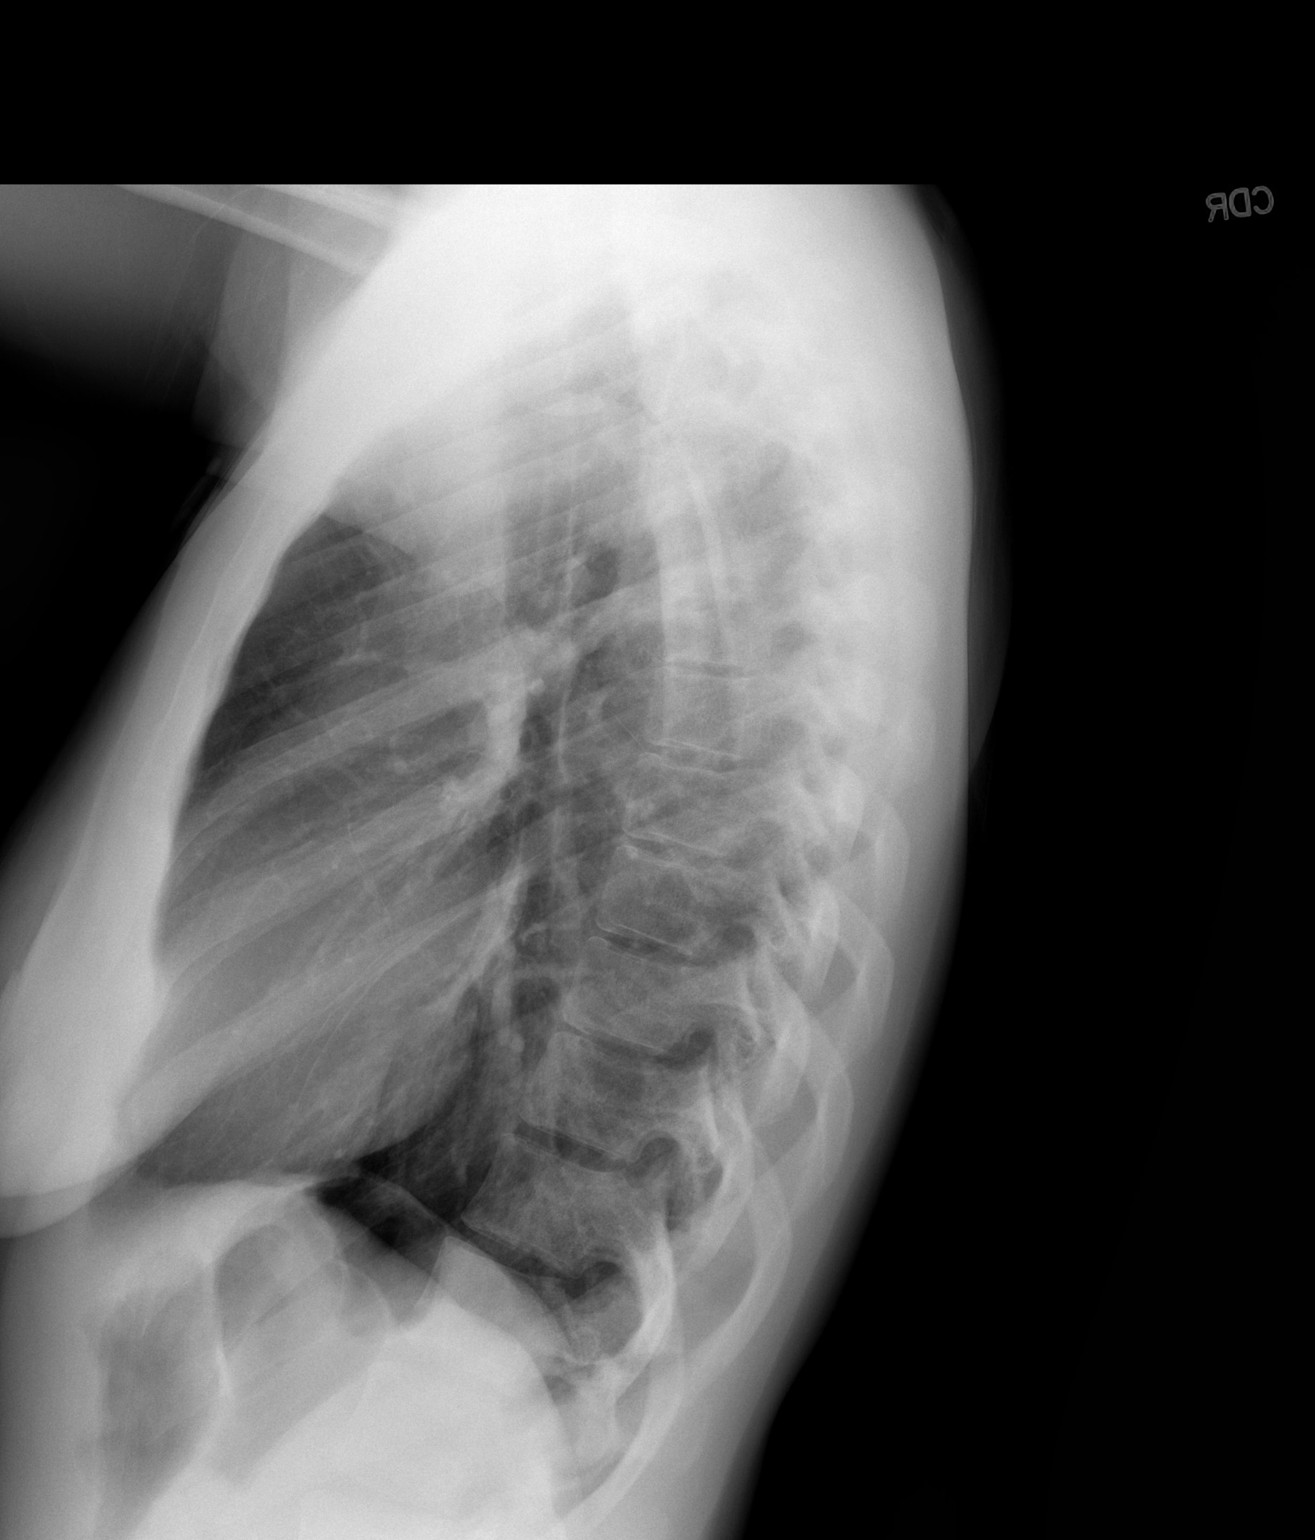

[2 of 2 positions shown; findings below may reference images not displayed]

FINDINGS: The heart size and mediastinal contours are within normal limits.
Both lungs are clear. The visualized skeletal structures are
unremarkable.
IMPRESSION: No active cardiopulmonary disease.

## 2019-10-12 ENCOUNTER — Emergency Department (HOSPITAL_BASED_OUTPATIENT_CLINIC_OR_DEPARTMENT_OTHER)
Admission: EM | Admit: 2019-10-12 | Discharge: 2019-10-13 | Disposition: A | Discharge status: Home / Self Care | Payer: Self-pay | Attending: Emergency Medicine | Admitting: Emergency Medicine

## 2019-10-12 ENCOUNTER — Encounter (HOSPITAL_BASED_OUTPATIENT_CLINIC_OR_DEPARTMENT_OTHER): Payer: Self-pay

## 2019-10-12 ENCOUNTER — Other Ambulatory Visit: Payer: Self-pay

## 2019-10-12 ENCOUNTER — Emergency Department (HOSPITAL_BASED_OUTPATIENT_CLINIC_OR_DEPARTMENT_OTHER): Payer: Self-pay

## 2019-10-12 DIAGNOSIS — F17210 Nicotine dependence, cigarettes, uncomplicated: Secondary | ICD-10-CM | POA: Insufficient documentation

## 2019-10-12 DIAGNOSIS — R0602 Shortness of breath: Secondary | ICD-10-CM | POA: Insufficient documentation

## 2019-10-12 DIAGNOSIS — R079 Chest pain, unspecified: Secondary | ICD-10-CM | POA: Insufficient documentation

## 2019-10-12 DIAGNOSIS — R002 Palpitations: Secondary | ICD-10-CM | POA: Insufficient documentation

## 2019-10-12 LAB — CBC WITH DIFFERENTIAL/PLATELET
Abs Immature Granulocytes: 0.01 10*3/uL (ref 0.00–0.07)
Basophils Absolute: 0 10*3/uL (ref 0.0–0.1)
Basophils Relative: 1 %
Eosinophils Absolute: 0.1 10*3/uL (ref 0.0–0.5)
Eosinophils Relative: 2 %
HCT: 38.1 % (ref 36.0–46.0)
Hemoglobin: 12.2 g/dL (ref 12.0–15.0)
Immature Granulocytes: 0 %
Lymphocytes Relative: 41 %
Lymphs Abs: 2.7 10*3/uL (ref 0.7–4.0)
MCH: 29.8 pg (ref 26.0–34.0)
MCHC: 32 g/dL (ref 30.0–36.0)
MCV: 92.9 fL (ref 80.0–100.0)
Monocytes Absolute: 0.4 10*3/uL (ref 0.1–1.0)
Monocytes Relative: 7 %
Neutro Abs: 3.3 10*3/uL (ref 1.7–7.7)
Neutrophils Relative %: 49 %
Platelets: 334 10*3/uL (ref 150–400)
RBC: 4.1 MIL/uL (ref 3.87–5.11)
RDW: 13.2 % (ref 11.5–15.5)
WBC: 6.6 10*3/uL (ref 4.0–10.5)
nRBC: 0 % (ref 0.0–0.2)

## 2019-10-12 LAB — BASIC METABOLIC PANEL
Anion gap: 9 (ref 5–15)
BUN: 10 mg/dL (ref 6–20)
CO2: 24 mmol/L (ref 22–32)
Calcium: 9.2 mg/dL (ref 8.9–10.3)
Chloride: 104 mmol/L (ref 98–111)
Creatinine, Ser: 0.85 mg/dL (ref 0.44–1.00)
GFR calc Af Amer: >60 mL/min (ref >60)
GFR calc non Af Amer: >60 mL/min (ref >60)
Glucose, Bld: 88 mg/dL (ref 70–99)
Potassium: 4.3 mmol/L (ref 3.5–5.1)
Sodium: 137 mmol/L (ref 135–145)

## 2019-10-12 NOTE — ED Provider Notes (Signed)
[facility] Provider Note: Lowella Dell, MD, FACEP  CSN: [id number] MRN: [id number] ARRIVAL: 10/12/19 at 1936 ROOM: MH02/[facility]02   CHIEF COMPLAINT  Shortness of Breath   HISTORY OF PRESENT ILLNESS  10/12/19 11:46 PM Patti Shorb is a 44 y.o. female who was sitting at her desk at work about 6:30 PM when she felt the sudden onset of shortness of breath with palpitations (rapid heartbeat).  Symptoms were moderate to severe but were not associated with chest pain.  She has never had this happen before.  Nothing made her symptoms better or worse.  She has no history of arrhythmia or anxiety.  Her symptoms lasted until she arrived here.  On arrival she was in normal sinus rhythm and she has been asymptomatic since.   History reviewed. No pertinent past medical history.  Past Surgical History:  Procedure Laterality Date  . CESAREAN SECTION    . CESAREAN SECTION      No family history on file.  Social History   Tobacco Use  . Smoking status: Current Every Day Smoker    Packs/day: 1.00    Types: Cigarettes  . Smokeless tobacco: Never Used  Vaping Use  . Vaping Use: Never used  Substance Use Topics  . Alcohol use: Yes    Comment: daily  . Drug use: No    Prior to Admission medications   Medication Sig Start Date End Date Taking? Authorizing Provider  meloxicam (MOBIC) 15 MG tablet Take 1 tablet (15 mg total) by mouth daily. 07/01/17   Maczis, Elmer Sow, PA-C  methocarbamol (ROBAXIN) 500 MG tablet Take 1 tablet (500 mg total) by mouth at bedtime as needed for muscle spasms. 07/01/17   Maczis, Elmer Sow, PA-C  naproxen (NAPROSYN) 500 MG tablet Take 1 tablet (500 mg total) by mouth 2 (two) times daily. 10/02/16   Dietrich Pates, PA-C    Allergies Patient has no known allergies.   REVIEW OF SYSTEMS  Negative except as noted here or in the History of Present Illness.   PHYSICAL EXAMINATION  Initial Vital Signs Blood pressure (!) 129/93, pulse 67, temperature 97.8 F  (36.6 C), temperature source Oral, resp. rate 14, height 5\' 6"  (1.676 m), weight 66.7 kg, last menstrual period 10/09/2019, SpO2 100 %.  Examination General: Well-developed, well-nourished female in no acute distress; appearance consistent with age of record HENT: normocephalic; atraumatic Eyes: pupils equal, round and reactive to light; extraocular muscles intact Neck: supple Heart: regular rate and rhythm Lungs: clear to auscultation bilaterally Abdomen: soft; nondistended; nontender; bowel sounds present Extremities: No deformity; full range of motion; pulses normal Neurologic: Awake, alert and oriented; motor function intact in all extremities and symmetric; no facial droop Skin: Warm and dry Psychiatric: Normal mood and affect   RESULTS  Summary of this visit's results, reviewed and interpreted by myself:   EKG Interpretation  Date/Time:  Thursday October 12 2019 19:49:28 EDT Ventricular Rate:  97 PR Interval:  126 QRS Duration: 62 QT Interval:  334 QTC Calculation: 424 R Axis:   73 Text Interpretation: Normal sinus rhythm Nonspecific T wave abnormality No significant change was found Confirmed by Arshawn Valdez, 10-03-1981 (Jonny Ruiz) on 10/12/2019 10:47:27 PM      Laboratory Studies: Results for orders placed or performed during the hospital encounter of 10/12/19 (from the past 24 hour(s))  CBC with Differential     Status: None   Collection Time: 10/12/19 11:24 PM  Result Value Ref Range   WBC 6.6 4.0 - 10.5 K/uL  RBC 4.10 3.87 - 5.11 MIL/uL   Hemoglobin 12.2 12.0 - 15.0 g/dL   HCT 10.1 36 - 46 %   MCV 92.9 80.0 - 100.0 fL   MCH 29.8 26.0 - 34.0 pg   MCHC 32.0 30.0 - 36.0 g/dL   RDW 75.1 02.5 - 85.2 %   Platelets 334 150 - 400 K/uL   nRBC 0.0 0.0 - 0.2 %   Neutrophils Relative % 49 %   Neutro Abs 3.3 1.7 - 7.7 K/uL   Lymphocytes Relative 41 %   Lymphs Abs 2.7 0.7 - 4.0 K/uL   Monocytes Relative 7 %   Monocytes Absolute 0.4 0 - 1 K/uL   Eosinophils Relative 2 %    Eosinophils Absolute 0.1 0 - 0 K/uL   Basophils Relative 1 %   Basophils Absolute 0.0 0 - 0 K/uL   Immature Granulocytes 0 %   Abs Immature Granulocytes 0.01 0.00 - 0.07 K/uL  Basic metabolic panel     Status: None   Collection Time: 10/12/19 11:24 PM  Result Value Ref Range   Sodium 137 135 - 145 mmol/L   Potassium 4.3 3.5 - 5.1 mmol/L   Chloride 104 98 - 111 mmol/L   CO2 24 22 - 32 mmol/L   Glucose, Bld 88 70 - 99 mg/dL   BUN 10 6 - 20 mg/dL   Creatinine, Ser 7.78 0.44 - 1.00 mg/dL   Calcium 9.2 8.9 - 24.2 mg/dL   GFR calc non Af Amer >60 >60 mL/min   GFR calc Af Amer >60 >60 mL/min   Anion gap 9 5 - 15   Imaging Studies: DG Chest Port 1 View  Result Date: 10/12/2019 CLINICAL DATA:  Dyspnea EXAM: PORTABLE CHEST 1 VIEW COMPARISON:  07/01/2017 FINDINGS: The heart size and mediastinal contours are within normal limits. Both lungs are clear. The visualized skeletal structures are unremarkable. IMPRESSION: No active disease. Electronically Signed   By: Helyn Numbers MD   On: 10/12/2019 20:39    ED COURSE and MDM  Nursing notes, initial and subsequent vitals signs, including pulse oximetry, reviewed and interpreted by myself.  Vitals:   10/12/19 1946 10/12/19 2235 10/12/19 2324  BP: (!) 134/102 (!) 136/101 (!) 129/93  Pulse: 98 65 67  Resp: 16 16 14   Temp: 98.6 F (37 C) 97.8 F (36.6 C)   TempSrc: Oral Oral   SpO2: 99% 100% 100%  Weight: 66.7 kg    Height: 5\' 6"  (1.676 m)     Medications - No data to display  The patient's history is consistent with a tachyarrhythmia, most commonly supraventricular tachycardia, which spontaneously resolved.  The patient's examination and diagnostic studies were normal now.  She was advised to return if symptoms recur.  PROCEDURES  Procedures   ED DIAGNOSES     ICD-10-CM   1. SOB (shortness of breath)  R06.02 DG Chest [facility] 1 View    DG Chest [facility] 1 View  2. Palpitations  R00.2        Roiza Wiedel, MD 10/12/19 2356

## 2019-10-12 NOTE — ED Triage Notes (Addendum)
Pt c/o SOB, heart racing x 20 min-denies CP-denies fever/flu sx-NAD-steady gait

## 2019-10-12 NOTE — ED Notes (Signed)
ED Provider at bedside. 

## 2020-04-03 ENCOUNTER — Encounter (HOSPITAL_BASED_OUTPATIENT_CLINIC_OR_DEPARTMENT_OTHER): Payer: Self-pay

## 2020-04-03 ENCOUNTER — Other Ambulatory Visit: Payer: Self-pay

## 2020-04-03 ENCOUNTER — Emergency Department (HOSPITAL_BASED_OUTPATIENT_CLINIC_OR_DEPARTMENT_OTHER)
Admission: EM | Admit: 2020-04-03 | Discharge: 2020-04-03 | Disposition: A | Discharge status: Home / Self Care | Payer: Medicaid Other | Attending: Emergency Medicine | Admitting: Emergency Medicine

## 2020-04-03 ENCOUNTER — Emergency Department (HOSPITAL_BASED_OUTPATIENT_CLINIC_OR_DEPARTMENT_OTHER): Payer: Medicaid Other

## 2020-04-03 DIAGNOSIS — F17210 Nicotine dependence, cigarettes, uncomplicated: Secondary | ICD-10-CM | POA: Insufficient documentation

## 2020-04-03 DIAGNOSIS — F4321 Adjustment disorder with depressed mood: Secondary | ICD-10-CM | POA: Insufficient documentation

## 2020-04-03 DIAGNOSIS — R251 Tremor, unspecified: Secondary | ICD-10-CM | POA: Insufficient documentation

## 2020-04-03 DIAGNOSIS — F419 Anxiety disorder, unspecified: Secondary | ICD-10-CM | POA: Insufficient documentation

## 2020-04-03 LAB — BASIC METABOLIC PANEL
Anion gap: 12 (ref 5–15)
BUN: 7 mg/dL (ref 6–20)
CO2: 21 mmol/L — ABNORMAL LOW (ref 22–32)
Calcium: 9.7 mg/dL (ref 8.9–10.3)
Chloride: 101 mmol/L (ref 98–111)
Creatinine, Ser: 0.93 mg/dL (ref 0.44–1.00)
GFR, Estimated: >60 mL/min (ref >60)
Glucose, Bld: 118 mg/dL — ABNORMAL HIGH (ref 70–99)
Potassium: 3.1 mmol/L — ABNORMAL LOW (ref 3.5–5.1)
Sodium: 134 mmol/L — ABNORMAL LOW (ref 135–145)

## 2020-04-03 LAB — CBC
HCT: 41.8 % (ref 36.0–46.0)
Hemoglobin: 13.4 g/dL (ref 12.0–15.0)
MCH: 29.1 pg (ref 26.0–34.0)
MCHC: 32.1 g/dL (ref 30.0–36.0)
MCV: 90.9 fL (ref 80.0–100.0)
Platelets: 320 10*3/uL (ref 150–400)
RBC: 4.6 MIL/uL (ref 3.87–5.11)
RDW: 13.5 % (ref 11.5–15.5)
WBC: 7.2 10*3/uL (ref 4.0–10.5)
nRBC: 0 % (ref 0.0–0.2)

## 2020-04-03 LAB — TROPONIN I (HIGH SENSITIVITY): Troponin I (High Sensitivity): 3 ng/L (ref <18)

## 2020-04-03 LAB — PREGNANCY, URINE: Preg Test, Ur: NEGATIVE

## 2020-04-03 MED ORDER — DIAZEPAM 5 MG PO TABS: 2.5000 mg | ORAL_TABLET | Freq: Three times a day (TID) | ORAL | 0 refills | Status: AC | PRN

## 2020-04-03 MED ORDER — LORAZEPAM 2 MG/ML IJ SOLN
1.0000 mg | Freq: Once | INTRAMUSCULAR | Status: AC
  Administered 2020-04-03: 1 mg via INTRAVENOUS
  Filled 2020-04-03: qty 1

## 2020-04-03 NOTE — ED Notes (Signed)
First EKG shown to EDP-too much artifact-to be redone

## 2020-04-03 NOTE — ED Notes (Signed)
ED Provider at bedside. 

## 2020-04-03 NOTE — ED Provider Notes (Signed)
[facility] EMERGENCY DEPARTMENT Provider Note   CSN: [id number] Arrival date & time: 04/03/20  1719     History Chief Complaint  Patient presents with  . Chest Pain    Mandy Barber is a 45 y.o. female who presents the emergency department with a chief complaint of chest pressure.  States that she has had a previous panic attack and wonders if her symptoms may be related to stress.  Patient states that she has been going through a lot of stress as her daughter recently gave birth to a stillborn baby and then had life-threatening bleeding.  She has been trying to take care of her daughter as well as planned the funeral for the stillborn infant.  She states that prior to all of this she was not having any kind of chest discomfort or racing heart however today she was on the phone with a funeral home and learned that the baby's funeral will be this coming Sunday.  She states that is soon as she got off the phone she started having tremendous chest pressure and felt like her heart was racing and she could not calm down.  She denies shortness of breath, chest pain, exertional dyspnea or exertional chest pain.  She denies a history of PEs or DVTs.  She does not see doctors regularly but denies a previous history of hypertension or hyperlipidemia.  The patient has no known primary relative with history of heart attack before the age of 11.  She is a daily smoker.  She denies hemoptysis, unilateral leg swelling, recent surgery or confinement.     HPI     History reviewed. No pertinent past medical history.  There are no problems to display for this patient.   Past Surgical History:  Procedure Laterality Date  . CESAREAN SECTION    . CESAREAN SECTION       OB History   No obstetric history on file.     No family history on file.  Social History   Tobacco Use  . Smoking status: Current Every Day Smoker    Packs/day: 1.00    Types: Cigarettes  . Smokeless tobacco: Never  Used  Vaping Use  . Vaping Use: Never used  Substance Use Topics  . Alcohol use: Yes    Comment: weekly  . Drug use: Yes    Types: Marijuana    Home Medications Prior to Admission medications   Medication Sig Start Date End Date Taking? Authorizing Provider  diazepam (VALIUM) 5 MG tablet Take 0.5-1 tablets (2.5-5 mg total) by mouth every 8 (eight) hours as needed for anxiety. 04/03/20  Yes Arthor Captain, PA-C    Allergies    Patient has no known allergies.  Review of Systems   Review of Systems Ten systems reviewed and are negative for acute change, except as noted in the HPI.   Physical Exam Updated Vital Signs BP (!) 149/106   Pulse 85   Temp 98.2 F (36.8 C) (Oral)   Resp 17   Ht 5\' 6"  (1.676 m)   Wt 67.6 kg   LMP 03/27/2020   SpO2 100%   BMI 24.05 kg/m   Physical Exam Vitals and nursing note reviewed.  Constitutional:      General: She is not in acute distress.    Appearance: She is well-developed and well-nourished. She is not diaphoretic.  HENT:     Head: Normocephalic and atraumatic.  Eyes:     General: No scleral icterus.  Conjunctiva/sclera: Conjunctivae normal.  Cardiovascular:     Rate and Rhythm: Normal rate and regular rhythm.     Heart sounds: Normal heart sounds. No murmur heard. No friction rub. No gallop.   Pulmonary:     Effort: Pulmonary effort is normal. No respiratory distress.     Breath sounds: Normal breath sounds.  Abdominal:     General: Bowel sounds are normal. There is no distension.     Palpations: Abdomen is soft. There is no mass.     Tenderness: There is no abdominal tenderness. There is no guarding.  Musculoskeletal:     Cervical back: Normal range of motion.  Skin:    General: Skin is warm and dry.  Neurological:     Mental Status: She is alert and oriented to person, place, and time.     Motor: Tremor present.  Psychiatric:        Mood and Affect: Mood is anxious.        Behavior: Behavior normal.     ED  Results / Procedures / Treatments   Labs (all labs ordered are listed, but only abnormal results are displayed) Labs Reviewed  BASIC METABOLIC PANEL - Abnormal; Notable for the following components:      Result Value   Sodium 134 (*)    Potassium 3.1 (*)    CO2 21 (*)    Glucose, Bld 118 (*)    All other components within normal limits  CBC  PREGNANCY, URINE  TSH  TROPONIN I (HIGH SENSITIVITY)  TROPONIN I (HIGH SENSITIVITY)    EKG EKG Interpretation  Date/Time:  Wednesday April 03 2020 18:21:43 EST Ventricular Rate:  90 PR Interval:  130 QRS Duration: 77 QT Interval:  337 QTC Calculation: 413 R Axis:   72 Text Interpretation: Sinus rhythm Atrial premature complex Borderline repolarization abnormality Confirmed by Zadie Rhine ([id number]) on 04/04/2020 9:08:52 AM   Radiology DG Chest 2 View  Result Date: 04/03/2020 CLINICAL DATA:  Chest pain EXAM: CHEST - 2 VIEW COMPARISON:  October 12, 2019 FINDINGS: The heart size and mediastinal contours are within normal limits. Both lungs are clear. The visualized skeletal structures are unremarkable. IMPRESSION: No active cardiopulmonary disease. Electronically Signed   By: Jonna Clark M.D.   On: 04/03/2020 18:05    Procedures Procedures   Medications Ordered in ED Medications  LORazepam (ATIVAN) injection 1 mg (1 mg Intravenous Given 04/03/20 1914)    ED Course  I have reviewed the triage vital signs and the nursing notes.  Pertinent labs & imaging results that were available during my care of the patient were reviewed by me and considered in my medical decision making (see chart for details).    MDM Rules/Calculators/A&P                        . OY:DXAJO PRESSURE VS:  Vitals:   04/03/20 1830 04/03/20 1900 04/03/20 1930 04/03/20 2000  BP: (!) 159/104 (!) 139/106 (!) 135/121 (!) 149/106  Pulse: (!) 109 74 83 85  Resp: 20 15 19 17   Temp:      TempSrc:      SpO2: 100% 99% 99% 100%  Weight:      Height:         is gathered by PATIENT and EMR. Previous records obtained and reviewed. DDX:The patient's complaint of chest pressure involves an extensive number of diagnostic and treatment options, and is a complaint that carries with it a high risk  of complications, morbidity, and potential mortality. Given the large differential diagnosis, medical decision making is of high complexity. The emergent differential diagnosis of chest pain includes: Acute coronary syndrome, pericarditis, aortic dissection, pulmonary embolism, tension pneumothorax, pneumonia, and esophageal rupture.  Labs: I ordered reviewed and interpreted labs which include Bmp with mild hypokalemia Cbc, troponin, and pregnancy tests wnl Imaging: I ordered and reviewed images which included 2 v cxr. I independently visualized and interpreted all imaging. There are no acute, significant findings on today's images. EKG: Initial EKG showed sinus tachycardia at a rate of about 127, final EKG after antianxiety medications showed normal sinus rhythm at a rate of 90 Consults: Case discussed with Dr. Rubin Payor  MDM: 45 year old female here with significant life stressor, history of anxiety attacks.  During initial HPI taking as I was able to distract the patient and speak with her very calmly I visibly watched her heart rate dropped from about 140 down to 80.  Her symptoms are not consistent with pulmonary embolus as she has no chest pain or shortness of breath hypoxia and I have very low suspicion for this given the significant improvement in her symptoms with both calming measures and antianxiety medication.  She has no history of previous.  She has no recent risk factors for the same.  Secondarily patient has a heart score of 2-3, EKG shows no evidence of ischemic abnormality and she has a normal troponin making ACS extremely unlikely as well. Given the patient's clinical presentation and in shared decision making patient feels this is an  anxiety attack he does not need further work-up.  I am also in agreement and will discharge patient with a short course of Valium during this extremely stressful time in her life.  I have discussed the fact that this is a sedating medication she should not use when making important decisions or driving, the patient should also understand that this can be a habit-forming medication.  I did review the PDMP prior to prescribing this medication.  Patient appears otherwise appropriate for discharge.  I did discuss strict return precautions with the patient. Patient disposition:The patient appears reasonably screened and/or stabilized for discharge and I doubt any other medical condition or other [facility] requiring further screening, evaluation, or treatment in the ED at this time prior to discharge. I have discussed lab and/or imaging findings with the patient and answered all questions/concerns to the best of my ability.I have discussed return precautions and OP follow up.     Final Clinical Impression(s) / ED Diagnoses Final diagnoses:  Grief reaction    Rx / DC Orders ED Discharge Orders         Ordered    diazepam (VALIUM) 5 MG tablet  Every 8 hours PRN        04/03/20 1958           Arthor Captain, PA-C 04/05/20 1004    Benjiman Core, MD 04/05/20 1456

## 2020-04-03 NOTE — ED Triage Notes (Signed)
Pt c/o central CP x 20 min-NAD-steady gait

## 2020-04-03 NOTE — ED Notes (Addendum)
Pt denis pain, but describes her chest as having presure. Pt states that she is aware of rapid heart rate but is not in distress. Pt has history of Panic attacks.

## 2020-04-03 NOTE — ED Notes (Signed)
EMT ran EKG x #2 and #3-both given to EDP

## 2020-04-03 NOTE — ED Notes (Signed)
Pt reports worsening tightness in her chest, this RN captured another EKG. Pt reports recent family loss and "bad nerves"

## 2020-04-03 NOTE — Discharge Instructions (Addendum)
Contact a health care provider if you: Have a hard time staying focused or finishing daily tasks. Spend many hours a day feeling worried about everyday life. Become exhausted by worry. Start to have headaches, feel tense, or have nausea. Urinate more than normal. Have diarrhea. Get help right away if you have: A racing heart and shortness of breath. Thoughts of hurting yourself or others.

## 2020-04-04 LAB — TSH: TSH: 0.565 u[IU]/mL (ref 0.350–4.500)

## 2020-08-30 ENCOUNTER — Other Ambulatory Visit: Payer: Self-pay

## 2020-08-30 ENCOUNTER — Encounter (HOSPITAL_BASED_OUTPATIENT_CLINIC_OR_DEPARTMENT_OTHER): Payer: Self-pay

## 2020-08-30 ENCOUNTER — Emergency Department (HOSPITAL_BASED_OUTPATIENT_CLINIC_OR_DEPARTMENT_OTHER)
Admission: EM | Admit: 2020-08-30 | Discharge: 2020-08-30 | Disposition: A | Discharge status: Home / Self Care | Payer: Medicaid Other | Attending: Emergency Medicine | Admitting: Emergency Medicine

## 2020-08-30 DIAGNOSIS — F17210 Nicotine dependence, cigarettes, uncomplicated: Secondary | ICD-10-CM | POA: Insufficient documentation

## 2020-08-30 DIAGNOSIS — U071 COVID-19: Secondary | ICD-10-CM | POA: Insufficient documentation

## 2020-08-30 DIAGNOSIS — M545 Low back pain, unspecified: Secondary | ICD-10-CM | POA: Insufficient documentation

## 2020-08-30 DIAGNOSIS — J0110 Acute frontal sinusitis, unspecified: Secondary | ICD-10-CM | POA: Insufficient documentation

## 2020-08-30 DIAGNOSIS — R109 Unspecified abdominal pain: Secondary | ICD-10-CM | POA: Insufficient documentation

## 2020-08-30 MED ORDER — AMOXICILLIN-POT CLAVULANATE 875-125 MG PO TABS: 1.0000 | ORAL_TABLET | Freq: Two times a day (BID) | ORAL | 0 refills | Status: AC

## 2020-08-30 NOTE — ED Triage Notes (Addendum)
Pt c/o CP "that started on my left then moved around to my pelvic and my back"-also c/o HA x 3 days-denies fever/flu sx-NAD-steady gait

## 2020-08-30 NOTE — ED Provider Notes (Signed)
[facility] EMERGENCY DEPARTMENT Provider Note   CSN: [id number] Arrival date & time: 08/30/20  1706     History Chief Complaint  Patient presents with   Chest Pain    Mandy Barber is a 45 y.o. female.  45 yo F with a chief complaints of pain that has been moving.  She felt she had some pain in her chest and then it moved to her abdomen and then it moved to her right flank.  She also been complaining of a headache.  She denies cough congestion or fever.  Denies nausea vomiting or diarrhea.  The pain seems to come and go at random nothing seems to make it better or worse.  She denies any urinary symptoms denies one-sided numbness or weakness denies difficulty speech or swallowing.  Denies trauma.  The history is provided by the patient.  Chest Pain Associated symptoms: back pain   Associated symptoms: no dizziness, no fever, no headache, no nausea, no palpitations, no shortness of breath and no vomiting   Illness Severity:  Moderate Onset quality:  Gradual Duration:  3 days Timing:  Intermittent Progression:  Waxing and waning Chronicity:  New Associated symptoms: chest pain   Associated symptoms: no congestion, no fever, no headaches, no myalgias, no nausea, no rhinorrhea, no shortness of breath, no vomiting and no wheezing       History reviewed. No pertinent past medical history.  There are no problems to display for this patient.   Past Surgical History:  Procedure Laterality Date   CESAREAN SECTION     CESAREAN SECTION       OB History   No obstetric history on file.     No family history on file.  Social History   Tobacco Use   Smoking status: Every Day    Packs/day: 1.00    Pack years: 0.00    Types: Cigarettes   Smokeless tobacco: Never  Vaping Use   Vaping Use: Never used  Substance Use Topics   Alcohol use: Yes    Comment: weekly   Drug use: Not Currently    Types: Marijuana    Home Medications Prior to Admission medications    Medication Sig Start Date End Date Taking? Authorizing Provider  amoxicillin-clavulanate (AUGMENTIN) 875-125 MG tablet Take 1 tablet by mouth 2 (two) times daily. One po bid x 7 days 08/30/20  Yes Melene Plan, DO  diazepam (VALIUM) 5 MG tablet Take 0.5-1 tablets (2.5-5 mg total) by mouth every 8 (eight) hours as needed for anxiety. 04/03/20   Arthor Captain, PA-C    Allergies    Patient has no known allergies.  Review of Systems   Review of Systems  Constitutional:  Negative for chills and fever.  HENT:  Negative for congestion and rhinorrhea.   Eyes:  Negative for redness and visual disturbance.  Respiratory:  Negative for shortness of breath and wheezing.   Cardiovascular:  Positive for chest pain. Negative for palpitations.  Gastrointestinal:  Negative for nausea and vomiting.  Genitourinary:  Negative for dysuria and urgency.  Musculoskeletal:  Positive for back pain. Negative for arthralgias and myalgias.  Skin:  Negative for pallor and wound.  Neurological:  Negative for dizziness and headaches.   Physical Exam Updated Vital Signs BP 136/88 (BP Location: Left Arm)   Pulse (!) 103   Temp 99.6 F (37.6 C) (Oral)   Resp 20   Ht 5\' 6"  (1.676 m)   Wt 68 kg   LMP 08/16/2020 (Approximate)  SpO2 98%   BMI 24.21 kg/m   Physical Exam Vitals and nursing note reviewed.  Constitutional:      General: She is not in acute distress.    Appearance: She is well-developed. She is not diaphoretic.  HENT:     Head: Normocephalic and atraumatic.     Comments: Posterior nasal drip, posterior pharyngeal erythema.  Left frontal sinus exquisite to percussion. Eyes:     Pupils: Pupils are equal, round, and reactive to light.  Cardiovascular:     Rate and Rhythm: Normal rate and regular rhythm.     Heart sounds: No murmur heard.   No friction rub. No gallop.  Pulmonary:     Effort: Pulmonary effort is normal.     Breath sounds: No wheezing or rales.  Abdominal:     General: There is no  distension.     Palpations: Abdomen is soft.     Tenderness: There is no abdominal tenderness.  Musculoskeletal:        General: No tenderness.     Cervical back: Normal range of motion and neck supple.  Skin:    General: Skin is warm and dry.  Neurological:     Mental Status: She is alert and oriented to person, place, and time.     GCS: GCS eye subscore is 4. GCS verbal subscore is 5. GCS motor subscore is 6.     Cranial Nerves: Cranial nerves are intact.     Sensory: Sensation is intact.     Motor: Motor function is intact.     Coordination: Coordination is intact.     Comments: Benign neurologic exam.  Psychiatric:        Behavior: Behavior normal.    ED Results / Procedures / Treatments   Labs (all labs ordered are listed, but only abnormal results are displayed) Labs Reviewed  SARS CORONAVIRUS 2 (TAT 6-24 HRS)    EKG EKG Interpretation  Date/Time:  Friday August 30 2020 17:25:50 EDT Ventricular Rate:  100 PR Interval:  128 QRS Duration: 68 QT Interval:  322 QTC Calculation: 415 R Axis:   69 Text Interpretation: Normal sinus rhythm ST & T wave abnormality, consider inferior ischemia Abnormal ECG downsloping st segments in inferior leads flipped t waves seen previously, though no prior depression noted Confirmed by Melene Plan [phone]) on 08/30/2020 6:25:26 PM  Radiology No results found.  Procedures Procedures   Medications Ordered in ED Medications - No data to display  ED Course  I have reviewed the triage vital signs and the nursing notes.  Pertinent labs & imaging results that were available during my care of the patient were reviewed by me and considered in my medical decision making (see chart for details).    MDM Rules/Calculators/A&P                          45 yo F with chief complaints of transient chest pain abdominal pain and right-sided low back pain and a headache.  This been off and on for 3 days now.  Chest pain was short lived and not  exertional.  She felt like it may be moved into her abdomen than her back.  She has a benign exam no obvious areas of tenderness.  Her neurologic exam is unremarkable.  It seems unlikely that she is having a aortic dissection pulses are 2+ and equal to both radial and dorsalis pedis pulses.  She does have some signs of an  upper respiratory infection.  We will treat as possible sinusitis.  PCP follow-up.  6:41 PM:  I have discussed the diagnosis/risks/treatment options with the patient and believe the pt to be eligible for discharge home to follow-up with PCP. We also discussed returning to the ED immediately if new or worsening sx occur. We discussed the sx which are most concerning (e.g., sudden worsening pain, fever, inability to tolerate by mouth) that necessitate immediate return. Medications administered to the patient during their visit and any new prescriptions provided to the patient are listed below.  Medications given during this visit Medications - No data to display   The patient appears reasonably screen and/or stabilized for discharge and I doubt any other medical condition or other [facility] requiring further screening, evaluation, or treatment in the ED at this time prior to discharge.   Final Clinical Impression(s) / ED Diagnoses Final diagnoses:  Acute frontal sinusitis, recurrence not specified    Rx / DC Orders ED Discharge Orders          Ordered    amoxicillin-clavulanate (AUGMENTIN) 875-125 MG tablet  2 times daily        08/30/20 1837             Melene Plan, DO 08/30/20 1841

## 2020-08-30 NOTE — Discharge Instructions (Addendum)
Take tylenol 2 pills 4 times a day and motrin 4 pills 3 times a day.  Drink plenty of fluids.  Return for worsening shortness of breath, headache, confusion. Follow up with your family doctor.   

## 2020-08-31 LAB — SARS CORONAVIRUS 2 (TAT 6-24 HRS): SARS Coronavirus 2: POSITIVE — AB

## 2020-09-27 ENCOUNTER — Encounter (HOSPITAL_BASED_OUTPATIENT_CLINIC_OR_DEPARTMENT_OTHER): Payer: Self-pay | Admitting: *Deleted

## 2020-09-27 ENCOUNTER — Other Ambulatory Visit: Payer: Self-pay

## 2020-09-27 ENCOUNTER — Emergency Department (HOSPITAL_BASED_OUTPATIENT_CLINIC_OR_DEPARTMENT_OTHER)
Admission: EM | Admit: 2020-09-27 | Discharge: 2020-09-27 | Disposition: A | Discharge status: Home / Self Care | Payer: Medicaid Other | Attending: Emergency Medicine | Admitting: Emergency Medicine

## 2020-09-27 DIAGNOSIS — X58XXXA Exposure to other specified factors, initial encounter: Secondary | ICD-10-CM | POA: Insufficient documentation

## 2020-09-27 DIAGNOSIS — F17210 Nicotine dependence, cigarettes, uncomplicated: Secondary | ICD-10-CM | POA: Insufficient documentation

## 2020-09-27 DIAGNOSIS — S0502XA Injury of conjunctiva and corneal abrasion without foreign body, left eye, initial encounter: Secondary | ICD-10-CM

## 2020-09-27 DIAGNOSIS — R0981 Nasal congestion: Secondary | ICD-10-CM | POA: Insufficient documentation

## 2020-09-27 MED ORDER — ERYTHROMYCIN 5 MG/GM OP OINT
TOPICAL_OINTMENT | Freq: Once | OPHTHALMIC | Status: AC
  Administered 2020-09-27: 1 via OPHTHALMIC
  Filled 2020-09-27: qty 3.5

## 2020-09-27 MED ORDER — TETRACAINE HCL 0.5 % OP SOLN
2.0000 [drp] | Freq: Once | OPHTHALMIC | Status: AC
  Administered 2020-09-27: 2 [drp] via OPHTHALMIC
  Filled 2020-09-27: qty 4

## 2020-09-27 MED ORDER — ERYTHROMYCIN 5 MG/GM OP OINT: TOPICAL_OINTMENT | OPHTHALMIC | 0 refills | Status: AC

## 2020-09-27 MED ORDER — CIPROFLOXACIN HCL 0.3 % OP SOLN: 1.0000 [drp] | OPHTHALMIC | 0 refills | Status: AC

## 2020-09-27 MED ORDER — CIPROFLOXACIN HCL 0.3 % OP SOLN: 1.0000 [drp] | OPHTHALMIC | 0 refills | Status: DC

## 2020-09-27 MED ORDER — FLUORESCEIN SODIUM 1 MG OP STRP
1.0000 | ORAL_STRIP | Freq: Once | OPHTHALMIC | Status: AC
  Administered 2020-09-27: 1 via OPHTHALMIC
  Filled 2020-09-27: qty 1

## 2020-09-27 NOTE — ED Notes (Signed)
ED Provider at bedside. 

## 2020-09-27 NOTE — Discharge Instructions (Addendum)
As we discussed, you have a corneal abrasion on your left eye.  Is very important that you treat this with the antibiotic eyedrops.  Additionally, you can apply the antibiotic ointment.  If you have no improvement in 24 hours, call Dr. Eliane Decree office tomorrow and asked to speak to him and he will see you.  Return emergency department any worsening pain, nausea or swelling of the eye, vision abnormality, fever or any other worsening concerning symptoms.

## 2020-09-27 NOTE — ED Notes (Signed)
Morgan lens applied to left eye, pt able to tolerated approx 200 ml NS of eye wash.  Pt asked to remove lens due to uncomfortable feeling,  Reports pain better after lens removed

## 2020-09-27 NOTE — ED Provider Notes (Signed)
[facility] EMERGENCY DEPARTMENT Provider Note   CSN: [id number] Arrival date & time: 09/27/20  2026     History Chief Complaint  Patient presents with   Eye Pain    Mandy Barber is a 45 y.o. female  for evaluation of left eye irritation.  She reports that she has had a few days of bilateral eye irritation associate with some nasal congestion.  She thought initially it was just allergies.  She reports last night, she is unsure if she took her contact out.  She states that today, the left eye has been bothering her, has been red and has been draining.  She denies any trauma, injury to the eye.  She denies any vision changes.  She has not noted any fevers.  She states that she has some sensitivity to light of the left eye.  The history is provided by the patient.      History reviewed. No pertinent past medical history.  There are no problems to display for this patient.   Past Surgical History:  Procedure Laterality Date   CESAREAN SECTION     CESAREAN SECTION       OB History   No obstetric history on file.     No family history on file.  Social History   Tobacco Use   Smoking status: Every Day    Packs/day: 0.50    Types: Cigarettes   Smokeless tobacco: Never  Vaping Use   Vaping Use: Never used  Substance Use Topics   Alcohol use: Yes    Comment: weekly   Drug use: Not Currently    Types: Marijuana    Home Medications Prior to Admission medications   Medication Sig Start Date End Date Taking? Authorizing Provider  erythromycin ophthalmic ointment Place a 1/2 inch ribbon of ointment into the lower eyelid. 09/27/20  Yes Graciella Freer A, PA-C  amoxicillin-clavulanate (AUGMENTIN) 875-125 MG tablet Take 1 tablet by mouth 2 (two) times daily. One po bid x 7 days 08/30/20   Melene Plan, DO  ciprofloxacin (CILOXAN) 0.3 % ophthalmic solution Place 1 drop into the right eye every 4 (four) hours for 5 days. Administer 1 drop, every 2 hours, while awake, for 2  days. Then 1 drop, every 4 hours, while awake, for the next 5 days. 09/27/20 10/02/20  Graciella Freer A, PA-C  diazepam (VALIUM) 5 MG tablet Take 0.5-1 tablets (2.5-5 mg total) by mouth every 8 (eight) hours as needed for anxiety. 04/03/20   Arthor Captain, PA-C    Allergies    Patient has no known allergies.  Review of Systems   Review of Systems  Constitutional:  Negative for fever.  Eyes:  Positive for photophobia, pain, discharge and redness. Negative for visual disturbance.  All other systems reviewed and are negative.  Physical Exam Updated Vital Signs BP (!) 191/109 (BP Location: Right Arm)   Pulse 68   Temp 98.2 F (36.8 C) (Oral)   Resp 18   Ht 5\' 6"  (1.676 m)   Wt 68 kg   LMP 09/15/2020   SpO2 100%   BMI 24.21 kg/m   Physical Exam Vitals and nursing note reviewed.  Constitutional:      Appearance: She is well-developed.  HENT:     Head: Normocephalic and atraumatic.  Eyes:     General: No scleral icterus.       Right eye: No discharge.        Left eye: No discharge.     Conjunctiva/sclera:  Conjunctivae normal.     Comments: Left conjunctival injection noted.  Pupils equal and reactive.  EOMs intact by difficulty.  No nystagmus.  She has some mild irritation of both the upper and lower eyelid.  Eyelids everted with shows no signs of foreign body. No consensual pain.   Pulmonary:     Effort: Pulmonary effort is normal.  Skin:    General: Skin is warm and dry.  Neurological:     Mental Status: She is alert.  Psychiatric:        Speech: Speech normal.        Behavior: Behavior normal.    ED Results / Procedures / Treatments   Labs (all labs ordered are listed, but only abnormal results are displayed) Labs Reviewed - No data to display  EKG None  Radiology No results found.  Procedures Procedures   Medications Ordered in ED Medications  fluorescein ophthalmic strip 1 strip (1 strip Both Eyes Given 09/27/20 2119)  tetracaine (PONTOCAINE) 0.5 %  ophthalmic solution 2 drop (2 drops Both Eyes Given 09/27/20 2119)  erythromycin ophthalmic ointment (1 application Left Eye Given 09/27/20 2222)    ED Course  I have reviewed the triage vital signs and the nursing notes.  Pertinent labs & imaging results that were available during my care of the patient were reviewed by me and considered in my medical decision making (see chart for details).    MDM Rules/Calculators/A&P                           45 year old female who presents for evaluation of left eye pain, irritation, redness.  She states initially couple days ago, she was having bilateral eye irritation that she thought was due to allergies.  Last night, she was unsure if she took her contact out and states that this morning, the eye was irritated, painful, red.  No trauma, injury to the eye.  She does report some photophobia to that eye.  No vision changes.  No fevers.  On initial arrival, she is afebrile, nontoxic-appearing.  Vital signs are stable.  She is slightly hypertensive but likely secondary to pain.  On exam, she has left conjunctival injection.  Pupils equal and reactive.  EOMs intact without any difficulty.  She does have some mild irritation of the upper and lower eyelids.  History/physical exam not concerning for preseptal or orbital cellulitis.  She does not have any consensual pain.  Doubt iritis.  Concern for corneal abrasion.  Lid was everted with sterile Q-tip.  I did not see any evidence of foreign body.  Intraocular pressure as documented below:  Left IOP: 21 Right IOP: 23  Woods lamp shows a small corneal abrasion at approximately the 6:00 region just over the iris.  It does not extend over the pupil.  No dendritic lesion.  Negative Seidel sign.  Evaluation with slit lamp shows no evidence of flares, cells.    Visual Acuity  Right Eye Distance: 20/25 Left Eye Distance: 20/25  Bilateral Distance: 20/25   Discussed patient with Dr. Allena Katz (optho).  He recommends  patient placing her on a fluoroquinolone eyedrop as well as topical erythromycin.  If she does not have any improvement by tomorrow, he should call his office and arrange for follow-up.  Patient was only able to tolerate about 100-200 cc of normal saline flushing eye.  She did have her help ports some improvement in pain.  Patient states she has to go.  I instructed patient to follow-up with Ortho tomorrow if she does not have improvement in symptoms. At this time, patient exhibits no emergent life-threatening condition that require further evaluation in ED. Patient had ample opportunity for questions and discussion. All patient's questions were answered with full understanding. Strict return precautions discussed. Patient expresses understanding and agreement to plan.      Portions of this note were generated with Scientist, clinical (histocompatibility and immunogenetics). Dictation errors may occur despite best attempts at proofreading.   Final Clinical Impression(s) / ED Diagnoses Final diagnoses:  Abrasion of left cornea, initial encounter    Rx / DC Orders ED Discharge Orders          Ordered    erythromycin ophthalmic ointment        09/27/20 2220    ciprofloxacin (CILOXAN) 0.3 % ophthalmic solution  Every 2 hours,   Status:  Discontinued        09/27/20 2220    ciprofloxacin (CILOXAN) 0.3 % ophthalmic solution  Every 4 hours        09/27/20 2221             Maxwell Caul, PA-C 09/27/20 2246    Nira Conn, MD 09/29/20 [phone]

## 2020-09-27 NOTE — ED Triage Notes (Addendum)
C/o eye irritation and pain  x 1 day, ? Foreign body

## 2021-02-01 ENCOUNTER — Other Ambulatory Visit: Payer: Self-pay

## 2021-02-01 ENCOUNTER — Emergency Department (HOSPITAL_BASED_OUTPATIENT_CLINIC_OR_DEPARTMENT_OTHER)
Admission: EM | Admit: 2021-02-01 | Discharge: 2021-02-01 | Disposition: A | Discharge status: Home / Self Care | Payer: Medicaid Other | Attending: Emergency Medicine | Admitting: Emergency Medicine

## 2021-02-01 ENCOUNTER — Encounter (HOSPITAL_BASED_OUTPATIENT_CLINIC_OR_DEPARTMENT_OTHER): Payer: Self-pay | Admitting: *Deleted

## 2021-02-01 DIAGNOSIS — F17210 Nicotine dependence, cigarettes, uncomplicated: Secondary | ICD-10-CM | POA: Insufficient documentation

## 2021-02-01 DIAGNOSIS — Z20822 Contact with and (suspected) exposure to covid-19: Secondary | ICD-10-CM | POA: Insufficient documentation

## 2021-02-01 DIAGNOSIS — J101 Influenza due to other identified influenza virus with other respiratory manifestations: Secondary | ICD-10-CM | POA: Insufficient documentation

## 2021-02-01 LAB — RESP PANEL BY RT-PCR (FLU A&B, COVID) ARPGX2
Influenza A by PCR: POSITIVE — AB
Influenza B by PCR: NEGATIVE
SARS Coronavirus 2 by RT PCR: NEGATIVE

## 2021-02-01 MED ORDER — BENZONATATE 100 MG PO CAPS: 100.0000 mg | ORAL_CAPSULE | Freq: Three times a day (TID) | ORAL | 0 refills | Status: AC | PRN

## 2021-02-01 NOTE — Discharge Instructions (Addendum)
You came to the emergency department today to be evaluated for your flulike symptoms.  You were positive for influenza.  You are considered infectious for influenza 5 days from symptom onset.  Please continue to isolate at home if you develop fevers until you are fever free for 24 hours.  You can alternate Tylenol/acetaminophen and Advil/ibuprofen/Motrin every 4 hours for sore throat, body aches, headache or fever.  Do not take more than 3,000 mg tylenol in a 24 hour period.  Please check all medication labels as many medications such as pain and cold medications may contain tylenol.  Do not drink alcohol while taking these medications.  Do not take other NSAID'S while taking ibuprofen (such as aleve or naproxen).  Please take ibuprofen with food to decrease stomach upset.  Please make sure to stay well hydrated.  Drink plenty of water or watered down sports drinks.  If drinking sports drinks please stay away from red colored drinks; as they can cause confusion for bleeding if you vomit.   Please make sure to practice good hand hygiene to help prevent the spread of flu.  You may use saline nasal spray for congestion.  Can use over-the-counter cough medication to help with your cough.  Follow up with your primary care provider if symptoms persist.  Return to the ER for inability to swallow liquids, difficulty breathing, or new or worsening symptoms.

## 2021-02-01 NOTE — ED Triage Notes (Signed)
Pt reports cough and body aches today

## 2021-02-01 NOTE — ED Provider Notes (Addendum)
[facility] EMERGENCY DEPARTMENT Provider Note   CSN: [id number] Arrival date & time: 02/01/21  2130     History Chief Complaint  Patient presents with   Cough    Mandy Barber is a 45 y.o. female presents emergency department with a chief complaint of flulike symptoms.  Patient reports that symptoms started yesterday.  Patient endorses headache, generalized myalgias, nonproductive cough, and chills.  Patient reports that headache is located to frontotemporal aspect of head.  Headache onset was gradual pain progressively worse over time.  Patient rates pain 10/10 on the pain scale.  No aggravating factors.  Patient has not tried any modalities to alleviate her symptoms.  Patient states that cough is nonproductive.  Patient reports known sick contacts with her niece who has tested positive for influenza.   Cough Associated symptoms: chills and headaches   Associated symptoms: no chest pain, no fever, no rash, no rhinorrhea, no shortness of breath and no sore throat       History reviewed. No pertinent past medical history.  There are no problems to display for this patient.   Past Surgical History:  Procedure Laterality Date   CESAREAN SECTION     CESAREAN SECTION       OB History   No obstetric history on file.     No family history on file.  Social History   Tobacco Use   Smoking status: Every Day    Packs/day: 0.50    Types: Cigarettes   Smokeless tobacco: Never  Vaping Use   Vaping Use: Every day  Substance Use Topics   Alcohol use: Yes    Comment: weekly   Drug use: Not Currently    Types: Marijuana    Home Medications Prior to Admission medications   Medication Sig Start Date End Date Taking? Authorizing Provider  amoxicillin-clavulanate (AUGMENTIN) 875-125 MG tablet Take 1 tablet by mouth 2 (two) times daily. One po bid x 7 days 08/30/20   Melene PlanFloyd, Dan, DO  diazepam (VALIUM) 5 MG tablet Take 0.5-1 tablets (2.5-5 mg total) by mouth every 8  (eight) hours as needed for anxiety. 04/03/20   Arthor CaptainHarris, Abigail, PA-C  erythromycin ophthalmic ointment Place a 1/2 inch ribbon of ointment into the lower eyelid. 09/27/20   Maxwell CaulLayden, Lindsey A, PA-C    Allergies    Patient has no known allergies.  Review of Systems   Review of Systems  Constitutional:  Positive for chills. Negative for fever.  HENT:  Negative for congestion, rhinorrhea, sore throat, trouble swallowing and voice change.   Eyes:  Negative for visual disturbance.  Respiratory:  Positive for cough. Negative for shortness of breath.   Cardiovascular:  Negative for chest pain.  Gastrointestinal:  Negative for abdominal pain, diarrhea, nausea and vomiting.  Musculoskeletal:  Negative for back pain and neck pain.  Skin:  Negative for color change and rash.  Neurological:  Positive for headaches. Negative for dizziness, tremors, seizures, syncope, facial asymmetry, speech difficulty, weakness, light-headedness and numbness.  Psychiatric/Behavioral:  Negative for confusion.    Physical Exam Updated Vital Signs BP (!) 153/108 (BP Location: Right Arm)   Pulse 99   Temp 99.6 F (37.6 C) (Oral)   Resp 18   Ht 5\' 6"  (1.676 m)   Wt 66.7 kg   LMP 01/25/2021   SpO2 95%   BMI 23.73 kg/m   Physical Exam Vitals and nursing note reviewed.  Constitutional:      General: She is not in acute distress.  Appearance: She is ill-appearing. She is not toxic-appearing or diaphoretic.  HENT:     Head: Normocephalic.     Jaw: No trismus, swelling or malocclusion.     Nose:     Right Sinus: No maxillary sinus tenderness or frontal sinus tenderness.     Left Sinus: No maxillary sinus tenderness or frontal sinus tenderness.     Mouth/Throat:     Lips: Pink. No lesions.     Mouth: Mucous membranes are moist.     Tongue: No lesions. Tongue does not deviate from midline.     Palate: No mass and lesions.     Pharynx: Oropharynx is clear. Uvula midline. No pharyngeal swelling, oropharyngeal  exudate, posterior oropharyngeal erythema or uvula swelling.     Tonsils: No tonsillar exudate or tonsillar abscesses. 2+ on the right. 2+ on the left.  Eyes:     General: No scleral icterus.       Right eye: No discharge.        Left eye: No discharge.  Cardiovascular:     Rate and Rhythm: Normal rate.  Pulmonary:     Effort: Pulmonary effort is normal. No tachypnea, bradypnea or respiratory distress.     Breath sounds: Normal breath sounds. No stridor. No wheezing, rhonchi or rales.  Musculoskeletal:     Cervical back: Normal range of motion and neck supple.  Skin:    General: Skin is warm and dry.  Neurological:     General: No focal deficit present.     Mental Status: She is alert.     GCS: GCS eye subscore is 4. GCS verbal subscore is 5. GCS motor subscore is 6.  Psychiatric:        Behavior: Behavior is cooperative.    ED Results / Procedures / Treatments   Labs (all labs ordered are listed, but only abnormal results are displayed) Labs Reviewed  RESP PANEL BY RT-PCR (FLU A&B, COVID) ARPGX2 - Abnormal; Notable for the following components:      Result Value   Influenza A by PCR POSITIVE (*)    All other components within normal limits    EKG None  Radiology No results found.  Procedures Procedures   Medications Ordered in ED Medications - No data to display  ED Course  I have reviewed the triage vital signs and the nursing notes.  Pertinent labs & imaging results that were available during my care of the patient were reviewed by me and considered in my medical decision making (see chart for details).    MDM Rules/Calculators/A&P                           Alert 45 year old female no acute distress, nontoxic appearing.  Patient does appear ill.  Presents to ED with chief complaint of flulike symptoms.  Patient reports that her symptoms started yesterday.  Patient has had known sick contacts with family member who is positive for influenza.  Patient speaks  in full complete sentences without difficulty.  Lungs clear to auscultation bilaterally.  Low suspicion for pneumonia at this time.  Neck supple.  Cervical range of motion intact.  No swelling to submandibular space.  No signs of strep pharyngitis or peritonsillar abscess.  Patient able to handle secretions without difficulty.  Patient reports that headache onset was gradual pain progressively worse over time.  No aggravating factors.  Denies any numbness, weakness, facial asymmetry, dysarthria.  Patient able to move all limbs  equally without difficulty.  No facial asymmetry noted.  No dysarthria.  No suspicion for subarachnoid hemorrhage at this time.  Patient positive for influenza A.  Suspect that all of patient's symptoms are secondary to influenza infection.  Shared decision making with patient about prescription for Tamiflu.  Patient declines Tamiflu prescription at this time.  Will prescribe patient with Tessalon for her cough.  Discussed symptomatic treatment with over-the-counter pain medication.  Discussed results, findings, treatment and follow up. Patient advised of return precautions. Patient verbalized understanding and agreed with plan.   Final Clinical Impression(s) / ED Diagnoses Final diagnoses:  Influenza A    Rx / DC Orders ED Discharge Orders          Ordered    benzonatate (TESSALON) 100 MG capsule  Every 8 hours PRN        02/01/21 2351             Haskel Schroeder, PA-C 02/02/21 0155    Haskel Schroeder, PA-C 02/02/21 0201    Geoffery Lyons, MD 02/02/21 0403

## 2022-10-02 ENCOUNTER — Encounter (HOSPITAL_BASED_OUTPATIENT_CLINIC_OR_DEPARTMENT_OTHER): Payer: Self-pay

## 2022-10-02 ENCOUNTER — Emergency Department (HOSPITAL_BASED_OUTPATIENT_CLINIC_OR_DEPARTMENT_OTHER)
Admission: EM | Admit: 2022-10-02 | Discharge: 2022-10-02 | Disposition: A | Discharge status: Home / Self Care | Payer: Medicaid Other | Attending: Emergency Medicine | Admitting: Emergency Medicine

## 2022-10-02 ENCOUNTER — Other Ambulatory Visit: Payer: Self-pay

## 2022-10-02 ENCOUNTER — Emergency Department (HOSPITAL_BASED_OUTPATIENT_CLINIC_OR_DEPARTMENT_OTHER): Payer: Medicaid Other

## 2022-10-02 DIAGNOSIS — Z79899 Other long term (current) drug therapy: Secondary | ICD-10-CM | POA: Diagnosis not present

## 2022-10-02 DIAGNOSIS — I159 Secondary hypertension, unspecified: Secondary | ICD-10-CM | POA: Diagnosis not present

## 2022-10-02 DIAGNOSIS — I10 Essential (primary) hypertension: Secondary | ICD-10-CM | POA: Diagnosis present

## 2022-10-02 LAB — CBC WITH DIFFERENTIAL/PLATELET
Abs Immature Granulocytes: 0.02 10*3/uL (ref 0.00–0.07)
Basophils Absolute: 0 10*3/uL (ref 0.0–0.1)
Basophils Relative: 1 %
Eosinophils Absolute: 0 10*3/uL (ref 0.0–0.5)
Eosinophils Relative: 0 %
HCT: 36 % (ref 36.0–46.0)
Hemoglobin: 11.8 g/dL — ABNORMAL LOW (ref 12.0–15.0)
Immature Granulocytes: 0 %
Lymphocytes Relative: 30 %
Lymphs Abs: 2.1 10*3/uL (ref 0.7–4.0)
MCH: 29.4 pg (ref 26.0–34.0)
MCHC: 32.8 g/dL (ref 30.0–36.0)
MCV: 89.8 fL (ref 80.0–100.0)
Monocytes Absolute: 0.4 10*3/uL (ref 0.1–1.0)
Monocytes Relative: 5 %
Neutro Abs: 4.4 10*3/uL (ref 1.7–7.7)
Neutrophils Relative %: 64 %
Platelets: 334 10*3/uL (ref 150–400)
RBC: 4.01 MIL/uL (ref 3.87–5.11)
RDW: 13 % (ref 11.5–15.5)
WBC: 6.9 10*3/uL (ref 4.0–10.5)
nRBC: 0 % (ref 0.0–0.2)

## 2022-10-02 LAB — BASIC METABOLIC PANEL
Anion gap: 8 (ref 5–15)
BUN: 10 mg/dL (ref 6–20)
CO2: 23 mmol/L (ref 22–32)
Calcium: 9.3 mg/dL (ref 8.9–10.3)
Chloride: 102 mmol/L (ref 98–111)
Creatinine, Ser: 0.99 mg/dL (ref 0.44–1.00)
GFR, Estimated: >60 mL/min (ref >60)
Glucose, Bld: 92 mg/dL (ref 70–99)
Potassium: 3.4 mmol/L — ABNORMAL LOW (ref 3.5–5.1)
Sodium: 133 mmol/L — ABNORMAL LOW (ref 135–145)

## 2022-10-02 LAB — URINALYSIS, MICROSCOPIC (REFLEX)

## 2022-10-02 LAB — URINALYSIS, ROUTINE W REFLEX MICROSCOPIC
Bilirubin Urine: NEGATIVE
Glucose, UA: NEGATIVE mg/dL
Ketones, ur: NEGATIVE mg/dL
Leukocytes,Ua: NEGATIVE
Nitrite: NEGATIVE
Protein, ur: NEGATIVE mg/dL
Specific Gravity, Urine: 1.015 (ref 1.005–1.030)
pH: 6 (ref 5.0–8.0)

## 2022-10-02 LAB — PREGNANCY, URINE: Preg Test, Ur: NEGATIVE

## 2022-10-02 LAB — TROPONIN I (HIGH SENSITIVITY): Troponin I (High Sensitivity): <2 ng/L (ref <18)

## 2022-10-02 MED ORDER — AMLODIPINE BESYLATE 5 MG PO TABS
5.0000 mg | ORAL_TABLET | Freq: Once | ORAL | Status: AC
  Administered 2022-10-02: 5 mg via ORAL
  Filled 2022-10-02: qty 1

## 2022-10-02 MED ORDER — AMLODIPINE BESYLATE 5 MG PO TABS: 5.0000 mg | ORAL_TABLET | Freq: Every day | ORAL | 0 refills | Status: AC

## 2022-10-02 MED ORDER — POTASSIUM CHLORIDE CRYS ER 20 MEQ PO TBCR: 20.0000 meq | EXTENDED_RELEASE_TABLET | Freq: Every day | ORAL | 0 refills | Status: AC

## 2022-10-02 NOTE — ED Provider Notes (Signed)
[place] EMERGENCY DEPARTMENT AT [facility] Provider Note   CSN: [id number] Arrival date & time: 10/02/22  1531     History Chief Complaint  Patient presents with   Hypertension    HPI Mandy Barber is a 47 y.o. female presenting for chief complaint of elevated blood pressure readings.  She is 47 year old female minimal medical history status post tubal ligation coming in with elevated blood pressure readings at [facility].  States that she has not seen her PCP in a long time to try to get her outpatient appointment but has not called to schedule this yet. Denies fevers chills nausea vomiting syncope shortness of breath.  Otherwise ambulatory tolerating p.o. intake.  Does endorse some "sparkles" in her vision currently not present..   Patient's recorded medical, surgical, social, medication list and allergies were reviewed in the Snapshot window as part of the initial history.   Review of Systems   Review of Systems  Constitutional:  Negative for chills and fever.  HENT:  Negative for ear pain and sore throat.   Eyes:  Negative for pain and visual disturbance.  Respiratory:  Negative for cough and shortness of breath.   Cardiovascular:  Negative for chest pain and palpitations.  Gastrointestinal:  Negative for abdominal pain and vomiting.  Genitourinary:  Negative for dysuria and hematuria.  Musculoskeletal:  Negative for arthralgias and back pain.  Skin:  Negative for color change and rash.  Neurological:  Negative for seizures and syncope.  All other systems reviewed and are negative.   Physical Exam Updated Vital Signs BP (!) 195/118   Pulse 68   Temp 98.8 F (37.1 C) (Oral)   Resp (!) 23   Ht 5\' 6"  (1.676 m)   Wt 66.2 kg   LMP 08/06/2022 (Approximate)   SpO2 100%   BMI 23.57 kg/m  Physical Exam Vitals and nursing note reviewed.  Constitutional:      General: She is not in acute distress.    Appearance: She is well-developed.  HENT:     Head:  Normocephalic and atraumatic.  Eyes:     Conjunctiva/sclera: Conjunctivae normal.  Cardiovascular:     Rate and Rhythm: Normal rate and regular rhythm.     Heart sounds: No murmur heard. Pulmonary:     Effort: Pulmonary effort is normal. No respiratory distress.     Breath sounds: Normal breath sounds.  Abdominal:     General: There is no distension.     Palpations: Abdomen is soft.     Tenderness: There is no abdominal tenderness. There is no right CVA tenderness or left CVA tenderness.  Musculoskeletal:        General: No swelling or tenderness. Normal range of motion.     Cervical back: Neck supple.  Skin:    General: Skin is warm and dry.  Neurological:     General: No focal deficit present.     Mental Status: She is alert and oriented to person, place, and time. Mental status is at baseline.     Cranial Nerves: No cranial nerve deficit.      ED Course/ Medical Decision Making/ A&P    Procedures Procedures   Medications Ordered in ED Medications  amLODipine (NORVASC) tablet 5 mg (has no administration in time range)   Medical Decision Making:   Mandy Barber is a 47 y.o. female who presented to the ED today with elevated blood pressure readings detailed above.     Complete initial physical exam performed, notably the  patient  was hemodynamically stable in no acute distress.  Initial blood pressure 175/111.  Equal Pulses bilaterally Reviewed and confirmed nursing documentation for past medical history, family history, social history.    Initial Assessment:   With the patient's presentation of elevated blood pressure readings, most likely diagnosis is hypertensive urgency. Other diagnoses associated with hypertensive emergency were considered including (but not limited to) intracranial hemorrhage, acute renal artery stenosis, acute kidney injury, myocardial stress, ophthalmologic emergencies. These are considered less likely due to history of present illness and physical  exam findings.   This is most consistent with an acute life/limb threatening illness complicated by underlying chronic conditions. Will evaluate for hypertensive emergency as below.  Initial Plan:  Screening labs including CBC and Metabolic panel to evaluate for infectious or metabolic etiology of disease.  Urinalysis with reflex culture ordered to evaluate for UTI or relevant urologic/nephrologic pathology.  CXR to evaluate for structural/infectious intrathoracic pathology.   troponin/EKG to evaluate for cardiac pathology. Objective evaluation as below reviewed. Considered further administration of antihypertensives in ED, per consensus guidelines for [facility] of emergency physicians, acute treatment of hypertensive urgency alone in the emergency department is not recommended.  If patient has evidence of hypertensive emergency on objective laboratory evaluation, will reevaluate.  Will monitor blood pressure while patient awaiting above laboratory studies.  Initial Study Results:   Laboratory  All laboratory results reviewed without evidence of clinically relevant pathology.      EKG EKG was reviewed independently. Rate, rhythm, axis, intervals all examined and without medically relevant abnormality. ST segments without concerns for elevations.    Radiology:  All images reviewed independently. Agree with radiology report at this time.   DG Chest Portable 1 View  Result Date: 10/02/2022 CLINICAL DATA:  HTN, eval for infectious/structural etiology EXAM: PORTABLE CHEST 1 VIEW COMPARISON:  CXR 04/03/20 FINDINGS: No pleural effusion. No pneumothorax. No focal airspace opacity. No radiographically apparent displaced rib fractures. Visualized upper abdomen is unremarkable. Normal cardiac and mediastinal contours. IMPRESSION: No focal airspace opacity Electronically Signed   By: Lorenza Cambridge M.D.   On: 10/02/2022 16:29    Reassessment and Plan:   Patient history present on his physicals and  findings remain most consistent with asymptomatic hypertension.  Prescribed antihypertensives (amlodipine due to low potassium otherwise would have started hydrochlorothiazide) and potassium supplementation for the next 5 days and recommend close follow-up with PCP which she states she will call to arrange.  Otherwise no acute distress stable for outpatient care at this time.  Clinical Impression:  1. Secondary hypertension      Discharge   Final Clinical Impression(s) / ED Diagnoses Final diagnoses:  Secondary hypertension    Rx / DC Orders ED Discharge Orders          Ordered    amLODipine (NORVASC) 5 MG tablet  Daily        10/02/22 1815    potassium chloride SA (KLOR-CON M) 20 MEQ tablet  Daily        10/02/22 1815              Glyn Ade, MD 10/02/22 1816

## 2022-10-02 NOTE — ED Triage Notes (Signed)
Patient was having floaters in her vision for 3 days. She stated she checked her blood pressure at home and it was high. She stated she is having a sharp pain in her abd sometimes but not right now.

## 2023-02-16 ENCOUNTER — Other Ambulatory Visit: Payer: Self-pay | Admitting: Physician Assistant

## 2023-02-16 DIAGNOSIS — Z1231 Encounter for screening mammogram for malignant neoplasm of breast: Secondary | ICD-10-CM

## 2023-03-23 ENCOUNTER — Ambulatory Visit
Admission: RE | Admit: 2023-03-23 | Discharge: 2023-03-23 | Disposition: A | Discharge status: Home / Self Care | Payer: Medicaid Other | Source: Ambulatory Visit | Attending: Physician Assistant | Admitting: Physician Assistant

## 2023-03-23 DIAGNOSIS — Z1231 Encounter for screening mammogram for malignant neoplasm of breast: Secondary | ICD-10-CM

## 2023-11-04 ENCOUNTER — Other Ambulatory Visit: Payer: Self-pay | Admitting: Physician Assistant

## 2023-11-04 DIAGNOSIS — Z1231 Encounter for screening mammogram for malignant neoplasm of breast: Secondary | ICD-10-CM

## 2024-01-17 ENCOUNTER — Other Ambulatory Visit: Payer: Self-pay

## 2024-01-17 ENCOUNTER — Encounter (HOSPITAL_COMMUNITY): Payer: Self-pay

## 2024-01-17 ENCOUNTER — Emergency Department (HOSPITAL_COMMUNITY)
Admission: EM | Admit: 2024-01-17 | Discharge: 2024-01-17 | Discharge status: Against Medical Advice | Attending: Emergency Medicine | Admitting: Emergency Medicine

## 2024-01-17 ENCOUNTER — Emergency Department (HOSPITAL_COMMUNITY)

## 2024-01-17 DIAGNOSIS — Z79899 Other long term (current) drug therapy: Secondary | ICD-10-CM | POA: Insufficient documentation

## 2024-01-17 DIAGNOSIS — R0789 Other chest pain: Secondary | ICD-10-CM | POA: Diagnosis present

## 2024-01-17 DIAGNOSIS — Z5329 Procedure and treatment not carried out because of patient's decision for other reasons: Secondary | ICD-10-CM | POA: Diagnosis not present

## 2024-01-17 DIAGNOSIS — I10 Essential (primary) hypertension: Secondary | ICD-10-CM | POA: Diagnosis not present

## 2024-01-17 LAB — BASIC METABOLIC PANEL WITH GFR
Anion gap: 14 (ref 5–15)
BUN: 18 mg/dL (ref 6–20)
CO2: 21 mmol/L — ABNORMAL LOW (ref 22–32)
Calcium: 9.3 mg/dL (ref 8.9–10.3)
Chloride: 100 mmol/L (ref 98–111)
Creatinine, Ser: 0.98 mg/dL (ref 0.44–1.00)
GFR, Estimated: >60 mL/min (ref >60)
Glucose, Bld: 98 mg/dL (ref 70–99)
Potassium: 3.8 mmol/L (ref 3.5–5.1)
Sodium: 135 mmol/L (ref 135–145)

## 2024-01-17 LAB — CBC
HCT: 36 % (ref 36.0–46.0)
Hemoglobin: 11.7 g/dL — ABNORMAL LOW (ref 12.0–15.0)
MCH: 29.5 pg (ref 26.0–34.0)
MCHC: 32.5 g/dL (ref 30.0–36.0)
MCV: 90.7 fL (ref 80.0–100.0)
Platelets: 359 K/uL (ref 150–400)
RBC: 3.97 MIL/uL (ref 3.87–5.11)
RDW: 13.7 % (ref 11.5–15.5)
WBC: 6.4 K/uL (ref 4.0–10.5)
nRBC: 0 % (ref 0.0–0.2)

## 2024-01-17 LAB — TROPONIN I (HIGH SENSITIVITY)
Troponin I (High Sensitivity): <2 ng/L (ref <18)
Troponin I (High Sensitivity): 3 ng/L (ref <18)

## 2024-01-17 LAB — RESP PANEL BY RT-PCR (RSV, FLU A&B, COVID)  RVPGX2
Influenza A by PCR: NEGATIVE
Influenza B by PCR: NEGATIVE
Resp Syncytial Virus by PCR: NEGATIVE
SARS Coronavirus 2 by RT PCR: NEGATIVE

## 2024-01-17 LAB — HCG, SERUM, QUALITATIVE: Preg, Serum: NEGATIVE

## 2024-01-17 NOTE — ED Notes (Signed)
 Awaiting patient from lobby.

## 2024-01-17 NOTE — ED Notes (Signed)
Phlebotomy to collect second troponin

## 2024-01-17 NOTE — ED Triage Notes (Addendum)
 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 A/Ox4. Also endorses feeling like her heart is beating fast & that she has Hx of anxiety & feeling anxious recently.

## 2024-01-17 NOTE — Discharge Instructions (Addendum)
 Evaluation of your chest tightness was overall reassuring.  Please follow-up your PCP.  If your symptoms return or you have any other concerning symptoms please return to the ED for further evaluation.

## 2024-01-17 NOTE — ED Provider Notes (Signed)
 [place] EMERGENCY DEPARTMENT AT [facility] Provider Note   CSN: [id number] Arrival date & time: 01/17/24  9264     Patient presents with: Chest Tightness  HPI Mandy Barber is a 48 y.o. female with history of hypertension and high cholesterol presenting for chest tightness.  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  It was nonradiating and nonexertional and no associated shortness of breath.  She states it lasted about an hour and improved.  She denies OCP use, calf tenderness or swelling, recent immobilization and known history of blood clots.  At this time she is asymptomatic.  Denies cough and fever.  History reviewed. No pertinent past medical history.    HPI     Prior to Admission medications   Medication Sig Start Date End Date Taking? Authorizing Provider  amLODipine  (NORVASC ) 5 MG tablet Take 1 tablet (5 mg total) by mouth daily. 10/02/22   Jerral Meth, MD  amoxicillin -clavulanate (AUGMENTIN ) 875-125 MG tablet Take 1 tablet by mouth 2 (two) times daily. One po bid x 7 days 08/30/20   Floyd, Dan, DO  benzonatate  (TESSALON ) 100 MG capsule Take 1 capsule (100 mg total) by mouth every 8 (eight) hours as needed for cough. 02/01/21   Eudelia Maude SAUNDERS, PA-C  diazepam  (VALIUM ) 5 MG tablet Take 0.5-1 tablets (2.5-5 mg total) by mouth every 8 (eight) hours as needed for anxiety. 04/03/20   Harris, Abigail, PA-C  erythromycin  ophthalmic ointment Place a 1/2 inch ribbon of ointment into the lower eyelid. 09/27/20   Layden, Lindsey A, PA-C  potassium chloride  SA (KLOR-CON  M) 20 MEQ tablet Take 1 tablet (20 mEq total) by mouth daily. 10/02/22   Jerral Meth, MD    Allergies: Patient has no known allergies.    Review of Systems See HPI  Updated Vital Signs BP (!) 131/98 (BP Location: Right Arm)   Pulse 72   Temp 98 F (36.7 C)   Resp 17   Ht 5' 6 (1.676 m)   Wt  73.5 kg   SpO2 100%   BMI 26.15 kg/m   Physical Exam Vitals and nursing note reviewed.  HENT:     Head: Normocephalic and atraumatic.     Mouth/Throat:     Mouth: Mucous membranes are moist.  Eyes:     General:        Right eye: No discharge.        Left eye: No discharge.     Conjunctiva/sclera: Conjunctivae normal.  Cardiovascular:     Rate and Rhythm: Normal rate and regular rhythm.     Pulses: Normal pulses.     Heart sounds: Normal heart sounds.  Pulmonary:     Effort: Pulmonary effort is normal.     Breath sounds: Normal breath sounds.  Abdominal:     General: Abdomen is flat.     Palpations: Abdomen is soft.  Skin:    General: Skin is warm and dry.  Neurological:     General: No focal deficit present.  Psychiatric:        Mood and Affect: Mood normal.     (all labs ordered are listed, but only abnormal results are displayed) Labs Reviewed  BASIC METABOLIC PANEL WITH GFR - Abnormal; Notable for the following components:      Result Value   CO2 21 (*)    All other components within normal limits  CBC - Abnormal; Notable for the following components:   Hemoglobin 11.7 (*)    All other components within normal limits  RESP PANEL BY RT-PCR (RSV, FLU A&B, COVID)  [id number]  HCG, SERUM, QUALITATIVE  TROPONIN I (HIGH SENSITIVITY)  TROPONIN I (HIGH SENSITIVITY)    EKG: EKG Interpretation Date/Time:  Monday January 17 2024 07:57:31 EST Ventricular Rate:  85 PR Interval:  128 QRS Duration:  68 QT Interval:  322 QTC Calculation: 383 R Axis:   75  Text Interpretation: Normal sinus rhythm Nonspecific ST abnormality Abnormal ECG When compared with ECG of 02-Oct-2022 16:30, PREVIOUS ECG IS PRESENT No significant change since last tracing Confirmed by Levander Houston [phone]) on 01/17/2024 12:13:23 PM  Radiology: [facility] Chest 2 View Result Date: 01/17/2024 CLINICAL DATA:  Chest pain. EXAM: CHEST - 2 VIEW COMPARISON:  10/02/2022. FINDINGS: The heart size and mediastinal  contours are within normal limits. No focal consolidation, pleural effusion, or pneumothorax. No acute osseous abnormality. IMPRESSION: No acute cardiopulmonary findings. Electronically Signed   By: Harrietta Sherry M.D.   On: 01/17/2024 09:08     Procedures   Medications Ordered in the ED - No data to display              HEART Score: 1                    Medical Decision Making Amount and/or Complexity of Data Reviewed Labs: ordered. Radiology: ordered.   Initial Impression and Ddx 48 year old well-appearing female presenting for chest tightness.  Exam is unremarkable.  DDx includes ACS, PE, pneumothorax, aortic dissection, other. Patient PMH that increases complexity of ED encounter:  none  Interpretation of Diagnostics - I independent reviewed and interpreted the labs as followed: unremarkable  - I independently visualized the following imaging with scope of interpretation limited to determining acute life threatening conditions related to emergency care: CXR, which revealed no acute findings  - I personally reviewed interpreted EKG which revealed an  Patient Reassessment and Ultimate Disposition/Management On assessment still without chest pain.  Workup unremarkable here.  Heart score is 1.  Doubt PE as well given no chest pain during counter and she is PERC negative.  Advised her to follow-up with her PCP.  Discussed return precautions.  Discharged.  Patient management required discussion with the following services or consulting groups:  None  Complexity of Problems Addressed Acute complicated illness or Injury  Additional Data Reviewed and Analyzed Further history obtained from: Past medical history and medications listed in the EMR and Prior ED visit notes  Patient Encounter Risk Assessment Consideration of hospitalization      Final diagnoses:  Chest tightness    ED Discharge Orders     None          Lang Norleen POUR, PA-C 01/17/24 1404     Levander Houston, MD 01/20/24 1457

## 2024-01-17 NOTE — ED Notes (Addendum)
 Pt back in h11

## 2024-01-17 NOTE — ED Provider Triage Note (Signed)
 Emergency Medicine Provider Triage Evaluation Note  Mandy Barber , a 48 y.o. female  was evaluated in triage.  Pt complains of shortness of breath and chest tightness that began acutely this morning, better with leaning forward. Has felt this way before with anxiety attacks previously.   Endorses R LE swelling that has been ongoing x 3-4 months, provided lasix by PCP which has helped with swelling.   Denies fever, headache, blurry vision, vertigo, cough, congestion, abdominal pain, n/v/d, dysuria, melena, hematochezia.   Review of Systems  Positive: N/a Negative: N/a  Physical Exam  BP (!) 153/104 (BP Location: Right Arm)   Pulse 90   Temp 98.1 F (36.7 C)   Resp 15   Ht 5' 6 (1.676 m)   Wt 73.5 kg   SpO2 97%   BMI 26.15 kg/m  Gen:   Awake, no distress   Resp:  Normal effort  MSK:   Moves extremities without difficulty  Other:    Medical Decision Making  Medically screening exam initiated at 8:09 AM.  Appropriate orders placed.  Mandy Barber was informed that the remainder of the evaluation will be completed by another provider, this initial triage assessment does not replace that evaluation, and the importance of remaining in the ED until their evaluation is complete.     Mandy Barber, [place] 01/17/24 [phone]

## 2024-03-23 ENCOUNTER — Ambulatory Visit
